# Patient Record
Sex: Female | Born: 1975 | Race: White | Hispanic: No | Smoking: Never smoker
Health system: Southern US, Community
[De-identification: ages and names within clinical notes are randomized; demographics above are authoritative.]

## PROBLEM LIST (undated history)

## (undated) DIAGNOSIS — N3010 Interstitial cystitis (chronic) without hematuria: Secondary | ICD-10-CM

---

## 1999-03-24 ENCOUNTER — Other Ambulatory Visit: Admission: RE | Admit: 1999-03-24 | Discharge: 1999-03-24 | Payer: Self-pay | Admitting: *Deleted

## 2000-04-27 ENCOUNTER — Other Ambulatory Visit: Admission: RE | Admit: 2000-04-27 | Discharge: 2000-04-27 | Payer: Self-pay | Admitting: *Deleted

## 2000-10-16 ENCOUNTER — Ambulatory Visit (HOSPITAL_COMMUNITY): Admission: RE | Admit: 2000-10-16 | Discharge: 2000-10-16 | Payer: Self-pay | Admitting: Urology

## 2001-04-29 ENCOUNTER — Other Ambulatory Visit: Admission: RE | Admit: 2001-04-29 | Discharge: 2001-04-29 | Payer: Self-pay | Admitting: *Deleted

## 2002-03-20 ENCOUNTER — Other Ambulatory Visit: Admission: RE | Admit: 2002-03-20 | Discharge: 2002-03-20 | Payer: Self-pay | Admitting: *Deleted

## 2003-04-21 ENCOUNTER — Other Ambulatory Visit: Admission: RE | Admit: 2003-04-21 | Discharge: 2003-04-21 | Payer: Self-pay | Admitting: *Deleted

## 2004-12-14 ENCOUNTER — Ambulatory Visit (HOSPITAL_COMMUNITY): Admission: RE | Admit: 2004-12-14 | Discharge: 2004-12-14 | Payer: Self-pay | Admitting: *Deleted

## 2017-11-30 DIAGNOSIS — Z86711 Personal history of pulmonary embolism: Secondary | ICD-10-CM | POA: Diagnosis not present

## 2017-11-30 DIAGNOSIS — I2699 Other pulmonary embolism without acute cor pulmonale: Secondary | ICD-10-CM | POA: Diagnosis not present

## 2017-11-30 DIAGNOSIS — I82611 Acute embolism and thrombosis of superficial veins of right upper extremity: Secondary | ICD-10-CM | POA: Diagnosis not present

## 2017-11-30 DIAGNOSIS — Z7901 Long term (current) use of anticoagulants: Secondary | ICD-10-CM | POA: Diagnosis not present

## 2017-12-13 DIAGNOSIS — I2699 Other pulmonary embolism without acute cor pulmonale: Secondary | ICD-10-CM | POA: Diagnosis not present

## 2017-12-13 DIAGNOSIS — R760 Raised antibody titer: Secondary | ICD-10-CM | POA: Diagnosis not present

## 2017-12-13 DIAGNOSIS — Z7901 Long term (current) use of anticoagulants: Secondary | ICD-10-CM | POA: Diagnosis not present

## 2017-12-13 DIAGNOSIS — I82611 Acute embolism and thrombosis of superficial veins of right upper extremity: Secondary | ICD-10-CM | POA: Diagnosis not present

## 2018-03-15 DIAGNOSIS — Z86711 Personal history of pulmonary embolism: Secondary | ICD-10-CM

## 2018-03-15 DIAGNOSIS — Z7901 Long term (current) use of anticoagulants: Secondary | ICD-10-CM

## 2018-03-15 DIAGNOSIS — Z86718 Personal history of other venous thrombosis and embolism: Secondary | ICD-10-CM

## 2021-10-24 ENCOUNTER — Other Ambulatory Visit: Payer: Self-pay

## 2021-10-24 ENCOUNTER — Ambulatory Visit (INDEPENDENT_AMBULATORY_CARE_PROVIDER_SITE_OTHER): Payer: BC Managed Care – PPO | Admitting: Psychiatry

## 2021-10-24 DIAGNOSIS — F331 Major depressive disorder, recurrent, moderate: Secondary | ICD-10-CM
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
## 2021-11-09 ENCOUNTER — Ambulatory Visit (INDEPENDENT_AMBULATORY_CARE_PROVIDER_SITE_OTHER): Payer: BC Managed Care – PPO | Admitting: Psychiatry

## 2021-11-09 DIAGNOSIS — F331 Major depressive disorder, recurrent, moderate: Secondary | ICD-10-CM

## 2021-11-09 NOTE — Progress Notes (Signed)
?    [facility] Counselor/Therapist Progress Note ? ?Patient ID: Jessica Gray, MRN: [id number],   ? ?Date: 11/09/2021 ? ?Time Spent: 58 minutes  ? ?Treatment Type: Individual Therapy ? ?Reported Symptoms: anxiety, restlessness, some tearfulness (some depression and some hormonal), depression(stronger symptom), worrying , "I have control issues too" ? ?Mental Status Exam: ? ?Appearance:   Casual     ?Behavior:  Appropriate, Sharing, and Motivated  ?Motor:  Normal  ?Speech/Language:   Clear and Coherent  ?Affect:  Depressed  ?Mood:  depressed  ?Thought process:  goal directed  ?Thought content:    overthinking  ?Sensory/Perceptual disturbances:    WNL  ?Orientation:  oriented to person, place, time/date, situation, day of week, month of year, year, and stated date of November 09, 2021  ?Attention:  Good  ?Concentration:  Good  ?Memory:  WNL  ?Fund of knowledge:   Good  ?Insight:    Good  ?Judgment:   Good  ?Impulse Control:  Good  ? ?Risk Assessment: ?Danger to Self:  No ?Self-injurious Behavior: No ?Danger to Others: No ?Duty to Warn:no ?Physical Aggression / Violence:No  ?Access to Firearms a concern: No  ?Gang Involvement:No  ? ?Subjective: Patient today reporting depression, anxiety, worrying , over-analyzing. Still pretty restless and lack of feeling purposeful. 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 Struggling feeling like so much of the world is based on marriage and kids "and I don't  fit into either of those, and wonder will I ever be married again and fit in"?  I have friends but even most of them are married and have families, and church is the same. Multiple losses have occurred recently. Focused more on her worrying today, "my mom was a worrier too", I worry excessively. Does daily devotional Verlon Au going to be ok"). Hard to just go "with the flow".  Focusing on letting go more of her doubts about her future. Really needing to work on being more positive  with herself vs negatives and "what's wrong with me".  ? ?Interventions: Solution-Oriented/Positive Psychology, Ego-Supportive, and Insight-Oriented ? ?Treatment goals: ?Treatment goals remain on treatment plan as patient works with strategies to achieve her goals.  Progress is assessed each session and documented in the "subject" and/or "plan" section of treatment note. ?Long-term goal: ?Elevate mood and show evidence of usual energy, activities, and socialization level. ?Short-term goal: ?Verbalize an understanding of the relationship between repressed anger and depressed mood. ?Strategies: ?Identify cognitive self-talk that is engaged in and supports depression and work to change the self-talk to be more positive and proactive.  ? ?Diagnosis: ?  ICD-10-CM   ?1. 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  ? ?Plan: Patient today showing good motivation and active participation in session, focusing on her depression, anxiety, and "not feeling like I'm fitting in at places due to not being married with family and kids." Losses that have occurred due to changes on her job. Occupational concerns  addressed and remain a significant stressor. Control issues are important to patient. To get new dog around May 1st and "that will be good company for me."  Worked on having a more positive focus and outlook versus a more narrow judgmental outlook for herself.  Is genuinely motivated and wanting to "have more self love for who I am, increase my self-confidence, do not be worrying so much about the future, have a more positive attitude, appreciate and living today, and to find  joy in everyday, with less self judgment and less negative assumptions. ? ?Goal review and progress/challenges noted with patient. ? ?Next appointment within 2 to 3 weeks. ? ?This record has been created using [employer].  Chart creation errors have been sought, but may not always have been located and corrected.  Such creation errors do  not reflect on the standard of medical care provided. ? ? ?Mathis Fare, LCSW ? ? ? ? ? ? ? ? ? ? ? ? ? ? ? ? ? ? ?

## 2021-11-28 ENCOUNTER — Ambulatory Visit (INDEPENDENT_AMBULATORY_CARE_PROVIDER_SITE_OTHER): Payer: BC Managed Care – PPO | Admitting: Psychiatry

## 2021-11-28 DIAGNOSIS — F331 Major depressive disorder, recurrent, moderate: Secondary | ICD-10-CM

## 2021-11-28 NOTE — Progress Notes (Signed)
?    [facility] Counselor/Therapist Progress Note ? ?Patient ID: Jessica Gray, MRN: [id number],   ? ?Date: 11/28/2021 ? ?Time Spent: 52 minutes  ? ?Treatment Type: Individual Therapy ? ?Reported Symptoms: depression, worries "a lot", some tearfulness and "may be hormonal", anxiety ? ?Mental Status Exam: ? ?Appearance:   Casual and Neat     ?Behavior:  Appropriate, Sharing, and Motivated  ?Motor:  Normal  ?Speech/Language:   Clear and Coherent  ?Affect:  Depressed and anxiety  ?Mood:  anxious and depressed  ?Thought process:  goal directed  ?Thought content:    Some rumination and overthinking  ?Sensory/Perceptual disturbances:    WNL  ?Orientation:  oriented to person, place, time/date, situation, day of week, month of year, year, and stated date of Nov 28, 2021  ?Attention:  Good  ?Concentration:  Good  ?Memory:  WNL  ?Fund of knowledge:   Good  ?Insight:    Good  ?Judgment:   Good  ?Impulse Control:  Good  ? ?Risk Assessment: ?Danger to Self:  No ?Self-injurious Behavior: No ?Danger to Others: No ?Duty to Warn:no ?Physical Aggression / Violence:No  ?Access to Firearms a concern: No  ?Gang Involvement:No  ? ?Subjective:  Patient in today reporting depression, worrying, anxiety, and overthinking/over-analyzing. Is getting a new dog later this week and having some anxiety about that but also looking forward to it. Decided to stay with her current job for now. "I don't know why I just can't be settled with life with where it is for me."  I want to be content and live a day at a time; I've always tended to look into the future versus present. Wanting to apprecitate the present moment and not focus on the negatives. Worked on this in session today as it relates to her depression and anxiety. Tends to stay in the past of jump ahead, so missing out a lot on the present. Self-critical. I feel like I have a dependence on others. Worries about her mom dying. Ok with dad but not as close. Tearfully processed her fears of  losing mother and with the fear of "dementia running in mom's family". Processed a lot of feelings that she admits has contributed to hold her back from moving forward. Wanting to have more hope and feeling more like that is actually possible for her as we worked on her practicing more self acceptance, pacing herself, keeping her expectations realistic, not being overly self-critical, and practice staying in the present versus the past not too far off into the future.  Less helplessness today.  Her faith is important to her and includes daily devotionals and praying for herself and others which she sees as being helpful to her on her therapy goals as well.  Working to let go of self doubt and trying to believe in herself more, recognizing her positives more than her negatives. ? ?Interventions: Solution-Oriented/Positive Psychology, Ego-Supportive, and Insight-Oriented ? ?Treatment goals: ?Treatment goals remain on treatment plan as patient works with strategies to achieve her goals.  Progress is assessed each session and documented in the "subject" and/or "plan" section of treatment note. ?Long-term goal: ?Elevate mood and show evidence of usual energy, activities, and socialization level. ?Short-term goal: ?Verbalize an understanding of the relationship between repressed anger and depressed mood. ?Strategies: ?Identify cognitive self-talk that is engaged in and supports depression and work to change the self-talk to be more positive and proactive.  ? ?Diagnosis: ?  ICD-10-CM   ?1. 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  ? ?Plan:  Patient today showing active participation and good motivation in session as she focused further on her worrying, depression, anxiety, overthinking/over analyzing, and self criticalness.  Anxiety is somewhat heightened by getting a new dog later this week but she is also looking forward to having a pet again and feeling that it would be a good addition for her.  Decided to  stay in current job for now.  Working hard today on her excessively high expectations of herself and some of the traits that she would like to achieve including being more self accepting and less self-critical, looking more for what might go right versus wrong, staying in the present instead of the past or jumping ahead.  Patient worked well on this in session today admitting where she sees "I sometimes get in my own way" and how she can help relieve this as well and treat herself in healthier and more self-loathing ways.  Worked continuously in session today on staying in the present, stop "beating herself up", talking to herself like she would a good friend rather than harshly, and keeping her expectations realistic as she works on all of the points noted above.  Showed improved motivation today and and seems to really want to create some changes for herself in a positive way.  Also needing to work on "control" issues which will be part of the process going forward.  Wants to not be so narrower in her judgment especially of herself, and have a more positive outlook.  Working to not worry so much about the future as she is discovering how the worry tends to rob her of having peace each day.  Encouraged patient in her practice of more positive behaviors including: Staying in the present and focusing on what she can control or change, believing in herself that she can make significant changes, staying in touch with people who are supportive, looking for more positives versus negatives each day, healthy nutrition and exercise, look more for the positives versus negatives, use encouraging positive self talk, reduce overthinking, saying no when she needs to say no, letting go of things in the past that can hold her back now, and recognize the strength she shows as she works with goal-directed behaviors to move in a direction that supports her improved emotional health ? ?Goal review and progress/challenges noted with  patient. ? ?Next appointment within 2 to 3 weeks. ? ?This record has been created using [employer].  Chart creation errors have been sought, but may not always have been located and corrected.  Such creation errors do not reflect on the standard of medical care provided. ? ? ?Mathis Fare, LCSW ? ? ? ? ? ? ? ? ? ? ? ? ? ? ? ? ? ? ?

## 2021-12-14 ENCOUNTER — Ambulatory Visit (INDEPENDENT_AMBULATORY_CARE_PROVIDER_SITE_OTHER): Payer: BC Managed Care – PPO | Admitting: Psychiatry

## 2021-12-14 DIAGNOSIS — F331 Major depressive disorder, recurrent, moderate: Secondary | ICD-10-CM

## 2021-12-14 NOTE — Progress Notes (Signed)
?    [facility] Counselor/Therapist Progress Note ? ?Patient ID: Jessica Gray, MRN: [id number],   ? ?Date: 12/14/2021 ? ?Time Spent: 52 minutes  ? ?Treatment Type: Individual Therapy ? ?Reported Symptoms: anxiety, some tearfulness usually related to family and "discontent" ? ?Mental Status Exam: ? ?Appearance:   Neat     ?Behavior:  Appropriate, Sharing, and Motivated  ?Motor:  Normal  ?Speech/Language:   Clear and Coherent  ?Affect:  Depressed and anxious  ?Mood:  anxious and depressed  ?Thought process:  goal directed  ?Thought content:    overthinking  ?Sensory/Perceptual disturbances:    WNL  ?Orientation:  oriented to person, place, time/date, situation, day of week, month of year, year, and stated date of Dec 14, 2021  ?Attention:  Good  ?Concentration:  Good and Fair  ?Memory:  WNL  ?Fund of knowledge:   Good  ?Insight:    Good and Fair  ?Judgment:   Good  ?Impulse Control:  Good  ? ?Risk Assessment: ?Danger to Self:  No ?Self-injurious Behavior: No ?Danger to Others: No ?Duty to Warn:no ?Physical Aggression / Violence:No  ?Access to Firearms a concern: No  ?Gang Involvement:No  ? ?Subjective:   Patient today reporting anxiety, overthinking/over analyzing, depression, worrying, and self criticalness. Adjusting to a new puppy she got a week ago. Over-stressing, over-worrying and always looking for what may go wrong versus right. Worked on this in session today with specific examples that she is experiencing in trying to be more positive over all. Processed several of her frequent anxious/fearful/depressive/worrisome thoughts/feelings and able to use those examples and work with in session, transitioning to less anxiety/less fear/less worry and patient seemed to feel a little more empowered at seeing how she can intercept the unproductive thoughts and replace them with more productive/positive/realistic thoughts that do not create so much anxiety, fear, depression, and worry for her on a daily basis.   Definitely more motivated today and seemed to really appreciate the specific examples we worked on in session.  She is to continue practicing this between sessions.  To work on staying in the present and not the past nor too far into the future.  Continue using her faith as a support, and working to let go of self doubt and believe more in herself and what she can accomplish. ? ?Interventions: Ego-Supportive and Insight-Oriented ?  ?Treatment goals: ?Treatment goals remain on treatment plan as patient works with strategies to achieve her goals.  Progress is assessed each session and documented in the "subject" and/or "plan" section of treatment note. ?Long-term goal: ?Elevate mood and show evidence of usual energy, activities, and socialization level. ?Short-term goal: ?Verbalize an understanding of the relationship between repressed anger and depressed mood. ?Strategies: ?Identify cognitive self-talk that is engaged in and supports depression and work to change the self-talk to be more positive and proactive.  ? ?Diagnosis: ?  ICD-10-CM   ?1. 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  ? ?Plan: Today showing good motivation and actively participated in session as she worked further on her anxiety, overthinking/over analyzing, some depression, worrying, and being self-critical (some improvement in how she treats herself).  As noted above she did very well and directly confronting some of her anxieties and fears and more importantly her overall habit of looking for the negatives or what might go wrong versus right.  Very sincere in her desire to change this and already showing noticeable improvement in her insight.  To follow up on  her homework, post reminders at her home, and to use journaling as a tool also.  Seems to be lightening up some on the "need to control".  Motivation improving.  Trying to believe more in herself and overall worry less. Encouraged patient to be practicing more of the positive  behaviors discussed in session including: Staying in the present and focusing on what she can change, believe in herself that she can make significant changes, stay in touch with people who are supportive, look more for the positives versus negatives, healthy nutrition and exercise, use encouraging positive self talk and stop the self negating, saying no when she needs to say no without guilt, reduce overthinking and over analyzing, letting go of things in the past that hold her back now, and realize the strength she shows when working with goal directed behaviors to move in a direction that supports her improved emotional health and overall outlook. ? ?Goal review and progress/challenges noted with patient. ? ?Next appointment within 3 weeks. ? ?This record has been created using [employer].  Chart creation errors have been sought, but may not always have been located and corrected.  Such creation errors do not reflect on the standard of medical care provided. ? ? ?Mathis Fare, LCSW ? ? ? ? ? ? ? ? ? ? ? ? ? ? ? ? ? ? ?

## 2021-12-22 ENCOUNTER — Encounter (HOSPITAL_BASED_OUTPATIENT_CLINIC_OR_DEPARTMENT_OTHER): Payer: Self-pay | Admitting: Emergency Medicine

## 2021-12-22 ENCOUNTER — Emergency Department (HOSPITAL_BASED_OUTPATIENT_CLINIC_OR_DEPARTMENT_OTHER)
Admission: EM | Admit: 2021-12-22 | Discharge: 2021-12-22 | Disposition: A | Discharge status: Home / Self Care | Payer: BC Managed Care – PPO | Attending: Emergency Medicine | Admitting: Emergency Medicine

## 2021-12-22 ENCOUNTER — Other Ambulatory Visit: Payer: Self-pay

## 2021-12-22 ENCOUNTER — Emergency Department (HOSPITAL_BASED_OUTPATIENT_CLINIC_OR_DEPARTMENT_OTHER): Payer: BC Managed Care – PPO

## 2021-12-22 DIAGNOSIS — W19XXXA Unspecified fall, initial encounter: Secondary | ICD-10-CM | POA: Diagnosis not present

## 2021-12-22 DIAGNOSIS — S0990XA Unspecified injury of head, initial encounter: Secondary | ICD-10-CM | POA: Diagnosis present

## 2021-12-22 DIAGNOSIS — Y92009 Unspecified place in unspecified non-institutional (private) residence as the place of occurrence of the external cause: Secondary | ICD-10-CM | POA: Diagnosis not present

## 2021-12-22 HISTORY — DX: Interstitial cystitis (chronic) without hematuria: N30.10

## 2021-12-22 NOTE — ED Notes (Signed)
Pt A&OX4 ambulatory at d/c with independent steady gait. Pt verbalized understanding of d/c instructions and follow up care. 

## 2021-12-22 NOTE — ED Notes (Signed)
Patient transported to CT 

## 2021-12-22 NOTE — ED Notes (Signed)
Ambulated to the room from triage, gait steady.

## 2021-12-22 NOTE — ED Triage Notes (Signed)
Pt had mechanical fall down ~ 4 stairs stairs and hit left posterior side of head on rock.  C/o headache and soreness in area of trama  + blood thinners (Eliquis) Denies LOC, vision changes, n/v, dizziness, fatigue.

## 2021-12-22 NOTE — Discharge Instructions (Signed)
Your history, exam, work-up today did not show evidence of significant intracranial injury.  As you are on blood thinners, fell and hit her head, we did get the CT imaging that did not show evidence of acute bleeding.  You proved stability now for nearly 2 hours without significant symptoms.  We feel you are safe for discharge home.  Please rest and stay hydrated and follow-up with your primary doctor.  If any symptoms change or worsen acutely, please return to the nearest emergency department.

## 2021-12-22 NOTE — ED Provider Notes (Signed)
[facility] EMERGENCY DEPARTMENT Provider Note   CSN: [id number] Arrival date & time: 12/22/21  1911     History  Chief Complaint  Patient presents with   Fall   Head Injury    Jessica Gray is a 46 y.o. female.  The history is provided by the patient and medical records. No language interpreter was used.  Fall This is a new problem. The current episode started 1 to 2 hours ago. The problem occurs rarely. The problem has not changed since onset.Associated symptoms include headaches. Pertinent negatives include no chest pain, no abdominal pain and no shortness of breath. Nothing aggravates the symptoms. Nothing relieves the symptoms. She has tried nothing for the symptoms.  Head Injury Associated symptoms: headache   Associated symptoms: no nausea, no neck pain and no vomiting       Home Medications Prior to Admission medications   Not on File      Allergies    Patient has no known allergies.    Review of Systems   Review of Systems  Constitutional:  Negative for chills, fatigue and fever.  HENT:  Negative for congestion.   Eyes:  Negative for visual disturbance.  Respiratory:  Negative for cough, chest tightness and shortness of breath.   Cardiovascular:  Negative for chest pain.  Gastrointestinal:  Negative for abdominal pain, constipation, diarrhea, nausea and vomiting.  Genitourinary:  Negative for flank pain.  Musculoskeletal:  Negative for back pain, neck pain and neck stiffness.  Skin:  Negative for rash and wound.  Neurological:  Positive for headaches. Negative for dizziness, facial asymmetry, weakness and light-headedness.  Psychiatric/Behavioral:  Negative for agitation and confusion.   All other systems reviewed and are negative.  Physical Exam Updated Vital Signs BP 131/78 (BP Location: Right Arm)   Pulse 81   Temp 98.4 F (36.9 C) (Oral)   Resp 18   Ht 5\' 1"  (1.549 m)   Wt 76.7 kg   LMP 11/28/2021 (Approximate)   SpO2 99%   BMI  31.93 kg/m  Physical Exam Vitals and nursing note reviewed.  Constitutional:      General: She is not in acute distress.    Appearance: She is well-developed. She is not ill-appearing, toxic-appearing or diaphoretic.  HENT:     Head:      Comments: Old left parietal tenderness and possible small hematoma.  No laceration.  No crepitance.  No focal neurologic deficits    Nose: Nose normal.     Mouth/Throat:     Mouth: Mucous membranes are moist.  Eyes:     Extraocular Movements: Extraocular movements intact.     Conjunctiva/sclera: Conjunctivae normal.     Pupils: Pupils are equal, round, and reactive to light.  Cardiovascular:     Rate and Rhythm: Normal rate and regular rhythm.     Heart sounds: No murmur heard. Pulmonary:     Effort: Pulmonary effort is normal. No respiratory distress.     Breath sounds: Normal breath sounds. No wheezing, rhonchi or rales.  Chest:     Chest wall: No tenderness.  Abdominal:     General: Abdomen is flat.     Palpations: Abdomen is soft.     Tenderness: There is no abdominal tenderness.  Musculoskeletal:        General: Tenderness present. No swelling.     Cervical back: Neck supple. No tenderness.  Skin:    General: Skin is warm and dry.     Capillary Refill: Capillary  refill takes less than 2 seconds.     Findings: No erythema.  Neurological:     General: No focal deficit present.     Mental Status: She is alert.     Sensory: No sensory deficit.     Motor: No weakness.     Coordination: Coordination normal.  Psychiatric:        Mood and Affect: Mood normal.    ED Results / Procedures / Treatments   Labs (all labs ordered are listed, but only abnormal results are displayed) Labs Reviewed - No data to display  EKG None  Radiology No results found.  Procedures Procedures    Medications Ordered in ED Medications - No data to display  ED Course/ Medical Decision Making/ A&P                           Medical Decision  Making Amount and/or Complexity of Data Reviewed Radiology: ordered.    VADA SWIFT is a 46 y.o. 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  According to patient, she was feeling at her baseline and normal today when she was sweeping her porch when she tripped and fell down 4 stairs hitting some rocks with her head.  She reports hitting her left parietal/occipital area but did not lose consciousness.  She denies any other injuries or complaints.  Denies any neck pain, chest pain, abdominal pain, or extremity pains.  Denies any vision changes or nausea or vomiting.  Denies any other complaints.  Headache is mild at this time and she denies any other problems.  Given her Eliquis use, she presents for evaluation for head injury.  Patient will be worked up to rule out potentially life-threatening injury with head injury on blood thinners.   On exam, head was slightly tender at the area of injury however no laceration or large hematoma seen.  No crepitance.  No focal neurologic deficits.  Neck nontender.  Chest nontender.  Abdomen nontender.  Lungs clear.  Patient otherwise well-appearing.  Had a shared decision-making conversation with patient we agreed to get a head CT given the Eliquis use and falling hitting her head.  If CT is reassuring, anticipate discharge home.  CT head showed no acute traumatic injury.  Patient informed of findings and we feel she is safe for discharge home.  She denies any significant headache at this time.  Patient will follow-up with PCP and understood return precautions for delayed bleed.  She had no other questions or concerns and was discharged in good condition.         Final Clinical Impression(s) / ED Diagnoses Final diagnoses:  Fall in home, initial encounter  Injury of head, initial encounter    Clinical  Impression: 1. Fall in home, initial encounter   2. Injury of head, initial encounter     Disposition: Discharge  Condition: Good  I have discussed the results, Dx and Tx plan with the pt(& family if present). He/she/they expressed understanding and agree(s) with the plan. Discharge instructions discussed at great length. Strict return precautions discussed and pt &/or family have verbalized understanding of the instructions. No further questions at time of discharge.    There are no discharge medications for this patient.   Follow Up: Gordan Payment., MD [address] [place] [address] [phone]  [facility] EM[facility]ENC[facility] DEPART[facility] [address] [id number] mc [address] [id number] [phone]        Kimon Loewen, Canary Brimhristopher J, MD 12/22/21 2126

## 2021-12-28 ENCOUNTER — Ambulatory Visit (INDEPENDENT_AMBULATORY_CARE_PROVIDER_SITE_OTHER): Payer: BC Managed Care – PPO | Admitting: Psychiatry

## 2021-12-28 DIAGNOSIS — F331 Major depressive disorder, recurrent, moderate: Secondary | ICD-10-CM

## 2021-12-28 NOTE — Progress Notes (Signed)
[facility] Counselor/Therapist Progress Note  Patient ID: Jessica Gray, MRN: [id number],    Date: 12/28/2021  Time Spent: 55 minutes   Treatment Type: Individual Therapy  Reported Symptoms:  depressed (some hopelessness "but not thinking of harming myself"), "also wonder if my hormones are off", mood fluctuates, some tearfulness, stressed, overthinking  Mental Status Exam:  Appearance:   Neat     Behavior:  Appropriate, Sharing, and Motivated  Motor:  Normal  Speech/Language:   Clear and Coherent  Affect:  Anxious, depressed  Mood:  anxious, depressed, and sad  Thought process:  goal directed  Thought content:    overthinking  Sensory/Perceptual disturbances:    WNL  Orientation:  oriented to person, place, time/date, situation, day of week, month of year, year, and stated date of Dec 28, 2021  Attention:  Good  Concentration:  Good  Memory:  WNL  Fund of knowledge:   Good  Insight:    Good  Judgment:   Good  Impulse Control:  Good   Risk Assessment: Danger to Self:  No Self-injurious Behavior: No Danger to Others: No Duty to Warn:no Physical Aggression / Violence:No  Access to Firearms a concern: No  Gang Involvement:No   Subjective:  Patient in today reporting anxiety, worrying, over analyzing and over thinking, some depression,but her self-criticalness "has gotten better". Has "been on Zoloft through PCP for over 6 months but it really hasn't helped, nor did Buspar". Has recently had more tearfulness, feeling alone, and what is my purpose. Denies any SI. Moodiness and some days are worse than others.  Has used Xanax as needed per PCP but not recently. Has used Ativan .5mg  some recently which does help a little bit. Adjusting to a new puppy she has had a few weeks.  Self-criticalness not quite as bad "since we spoke about it last session." Some over-worrying and stressing as her tendency is to look more for what might go wrong versus right." Some good days but  hard to stay in positive mindset. Is in contact with friends, including one that knows patient is struggling emotionally right now. Has been 20 yrs since she was married and has not married again. Hoping to meet more people through "life-groups" at church.  Is doing some better at "staying in today and not jumping too far into the future."  Interventions: Solution-Oriented/Positive Psychology, Ego-Supportive, and Insight-Oriented  Treatment goals: Treatment goals remain on treatment plan as patient works with strategies to achieve her goals.  Progress is assessed each session and documented in the "subject" and/or "plan" section of treatment note. Long-term goal: Elevate mood and show evidence of usual energy, activities, and socialization level. Short-term goal: Verbalize an understanding of the relationship between repressed anger and depressed mood. Strategies: Identify cognitive self-talk that is engaged in and supports depression and work to change the self-talk to be more positive and proactive.   Diagnosis:   ICD-10-CM   1. 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  Got her scheduled to see , DNP  for medication evaluation within a month.  Her self criticalness has decreased.  Some difficulty when she does not feel and "control of things".  Does have some better moments but is also having significant "  downness, some irritability, overthinking, anxiety, worrying and assuming the worst, and difficulty hanging onto more positive feelings."  Denies any SI.  Has good close family relationships and some friends.  Involved at her church.  Is enjoying her puppy that she has had a few weeks.  To continue involvement with  family and friends, work on not being so judgmental of herself especially at work, reaching out to others and getting involved in the group she spoke about today at her church, interrupting negative thinking and replacing it with more accurate and typically positive thinking, emphasize her strengths, enjoying getting out with her dog, find more activities that she enjoys, and work on letting go of some things in the past that contribute to holding her back in the present from moving forward. Encouraged patient in her practice of more positive behaviors discussed in session including: Believing in herself that she can make significant changes, staying in the present and focusing on what she can change versus not change, remaining in touch with people who are supportive, looking more for the positives versus negatives daily, healthy nutrition and exercise, use encouraging positive self talk and stop the self negating, saying no when she needs to say no without guilt, reduce overthinking and over analyzing, letting go of things in the past that hold her back now, and recognize the strength she shows when she works with goal-directed behaviors to move in a direction that supports her improved emotional health, self-confidence, and decision making.  Review and progress/challenges noted with patient.  Next appointment within 2 to 3 weeks.  This record has been created using [employer].  Chart creation errors have been sought, but may not always have been located and corrected.  Such creation errors do not reflect on the standard of medical care provided.   Mathis Fare, LCSW

## 2022-01-09 ENCOUNTER — Ambulatory Visit: Payer: BC Managed Care – PPO | Admitting: Psychiatry

## 2022-01-19 ENCOUNTER — Encounter: Payer: Self-pay | Admitting: Adult Health

## 2022-01-19 ENCOUNTER — Ambulatory Visit: Payer: BC Managed Care – PPO | Admitting: Adult Health

## 2022-01-19 VITALS — BP 124/75 | Ht 61.0 in | Wt 171.0 lb

## 2022-01-19 DIAGNOSIS — F411 Generalized anxiety disorder: Secondary | ICD-10-CM | POA: Diagnosis not present

## 2022-01-19 DIAGNOSIS — F331 Major depressive disorder, recurrent, moderate: Secondary | ICD-10-CM | POA: Diagnosis not present

## 2022-01-19 MED ORDER — SERTRALINE HCL 100 MG PO TABS: 100.0000 mg | ORAL_TABLET | Freq: Every day | ORAL | 2 refills | Status: DC

## 2022-01-19 MED ORDER — BUPROPION HCL ER (XL) 150 MG PO TB24: ORAL_TABLET | ORAL | 2 refills | Status: DC

## 2022-01-19 NOTE — Progress Notes (Signed)
[facility] MD/PA/NP Initial Note  01/19/2022 4:23 PM Jessica Gray  MRN:  [id number]  Chief Complaint:   HPI:   Patient seen in the clinic today for initial psychiatric evaluation.  Previously seen by Dr. Donell Beers.  Describes mood today as "not so good". Pleasant. Tearful at times. Mood symptoms - reports depression, anxiety and irritability. Reports worry and rumination. Reports over thinking - going to the worst possible conclusion - blowing things up bigger than they are. Has a hard time letting go of things. 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 Hard for me to just relax and let go. Not coping well. Stating "life feels overwhelming - not in a place I thought I would be in". Having a difficult time finding joy in things. Decreased interest and motivation. Taking medications as prescribed.  Energy levels lower. Active, does not have a regular exercise routine - hip pain. Enjoys some usual interests and activities. Divorced. No children. Relationship of 10 years ended. Lives alone with dog - 4 months old. Spending time with family and friends. Talking to mother several times a day - "we are really close. Appetite adequate. Weight stable. Sleeps well most nights. Averages 6 to 7 hours. Focus and concentration stable. Completing tasks. Managing aspects of household. Works as a Physicist, medical Denies SI or HI.  Denies AH or VH.  Previous medication trials:  multiple trials  Visit Diagnosis:    ICD-10-CM   1. Major depressive disorder, recurrent episode, moderate (HCC)  F33.1 buPROPion (WELLBUTRIN XL) 150 MG 24 hr tablet    sertraline (ZOLOFT) 100 MG tablet    2. Generalized anxiety disorder  F41.1 buPROPion (WELLBUTRIN XL) 150 MG 24 hr tablet    sertraline (ZOLOFT) 100 MG tablet      Past Psychiatric History: Denies psychiatric hospitalization.   Past Medical History:  Past Medical History:  Diagnosis Date   Interstitial cystitis    No past  surgical history on file.  Family Psychiatric History: Denies any family history of mental illness.   Family History: No family history on file.  Social History:  Social History   Socioeconomic History   Marital status: Divorced    Spouse name: Not on file   Number of children: Not on file   Years of education: Not on file   Highest education level: Not on file  Occupational History   Not on file  Tobacco Use   Smoking status: Never   Smokeless tobacco: Never  Substance and Sexual Activity   Alcohol use: Not Currently   Drug use: Never   Sexual activity: Yes  Other Topics Concern   Not on file  Social History Narrative   Not on file   Social Determinants of Health   Financial Resource Strain: Not on file  Food Insecurity: Not on file  Transportation Needs: Not on file  Physical Activity: Not on file  Stress: Not on file  Social Connections: Not on file    Allergies: No Known Allergies  Metabolic Disorder Labs: No results found for: "HGBA1C", "MPG" No results found for: "PROLACTIN" No results found for: "CHOL", "TRIG", "HDL", "CHOLHDL", "VLDL", "LDLCALC" No results found for: "TSH"  Therapeutic Level Labs: No results found for: "LITHIUM" No results found for: "VALPROATE" No results found for: "CBMZ"  Current Medications: Current Outpatient Medications  Medication Sig Dispense Refill   buPROPion (WELLBUTRIN XL) 150 MG 24 hr tablet Take one tablet every morning for 7 days, then increase to two  tablets every morning. 60 tablet 2   sertraline (ZOLOFT) 100 MG tablet Take 1 tablet (100 mg total) by mouth daily. 30 tablet 2   sertraline (ZOLOFT) 100 MG tablet Take 100 mg by mouth daily.     No current facility-administered medications for this visit.    Medication Side Effects: none  Orders placed this visit:  No orders of the defined types were placed in this encounter.   Psychiatric Specialty Exam:  Review of Systems  Musculoskeletal:  Negative for gait  problem.  Neurological:  Negative for tremors.  Psychiatric/Behavioral:         Please refer to HPI    Last menstrual period 11/28/2021.There is no height or weight on file to calculate BMI.  General Appearance: Casual and Neat  Eye Contact:  Good  Speech:  Clear and Coherent and Normal Rate  Volume:  Normal  Mood:  Depressed  Affect:  Appropriate and Congruent  Thought Process:  Coherent and Descriptions of Associations: Intact  Orientation:  Full (Time, Place, and Person)  Thought Content: Logical   Suicidal Thoughts:  No  Homicidal Thoughts:  No  Memory:  WNL  Judgement:  Good  Insight:  Good  Psychomotor Activity:  Normal  Concentration:  Concentration: Good  Recall:  Good  Fund of Knowledge: Good  Language: Good  Assets:  Communication Skills Desire for Improvement Financial Resources/Insurance Housing Intimacy Leisure Time Physical Health Resilience Social Support Talents/Skills Transportation Vocational/Educational  ADL's:  Intact  Cognition: WNL  Prognosis:  Good   Screenings:  Flowsheet Row ED from 12/22/2021 in [facility] EM[facility]  C-SSRS RISK CATEGORY No Risk       Receiving Psychotherapy: Yes   Treatment Plan/Recommendations:   Plan:  PDMP reviewed  Continue Zoloft 100mg  daily Add Wellbutrin XL 150mg  every morning x 7, then increase to 300mg  daily - denies seizure history.  Time spent with patient was 60 minutes. Greater than 50% of face to face time with patient was spent on counseling and coordination of care.    RTC 4 weeks  Patient advised to contact office with any questions, adverse effects, or acute worsening in signs and symptoms.     , NP

## 2022-01-23 ENCOUNTER — Other Ambulatory Visit: Payer: Self-pay | Admitting: Adult Health

## 2022-01-23 ENCOUNTER — Ambulatory Visit (INDEPENDENT_AMBULATORY_CARE_PROVIDER_SITE_OTHER): Payer: BC Managed Care – PPO | Admitting: Psychiatry

## 2022-01-23 ENCOUNTER — Telehealth: Payer: Self-pay | Admitting: Adult Health

## 2022-01-23 DIAGNOSIS — F411 Generalized anxiety disorder: Secondary | ICD-10-CM

## 2022-01-23 DIAGNOSIS — F331 Major depressive disorder, recurrent, moderate: Secondary | ICD-10-CM | POA: Diagnosis not present

## 2022-01-23 MED ORDER — LORAZEPAM 0.5 MG PO TABS: 0.5000 mg | ORAL_TABLET | Freq: Two times a day (BID) | ORAL | 0 refills | Status: DC | PRN

## 2022-01-23 NOTE — Telephone Encounter (Signed)
LVM to RC 

## 2022-02-06 ENCOUNTER — Ambulatory Visit (INDEPENDENT_AMBULATORY_CARE_PROVIDER_SITE_OTHER): Payer: BC Managed Care – PPO | Admitting: Psychiatry

## 2022-02-06 DIAGNOSIS — F411 Generalized anxiety disorder: Secondary | ICD-10-CM | POA: Diagnosis not present

## 2022-02-06 NOTE — Progress Notes (Signed)
[facility] Counselor/Therapist Progress Note  Patient ID: Jessica Gray, MRN: [id number],    Date: 02/06/2022  Time Spent: 58 minutes  Treatment Type: Individual Therapy  Reported Symptoms: anxiety, depression  Mental Status Exam:  Appearance:   Casual     Behavior:  Appropriate, Sharing, and Motivated  Motor:  Normal  Speech/Language:   Clear and Coherent  Affect:  anxious  Mood:  anxious  Thought process:  goal directed  Thought content:    Some overthinking  Sensory/Perceptual disturbances:    WNL  Orientation:  oriented to person, place, time/date, situation, day of week, month of year, year, and stated date of February 06, 2022  Attention:  Fair  Concentration:  Good and Fair  Memory:  WNL  Fund of knowledge:   Good  Insight:    Good  Judgment:   Good  Impulse Control:  Fair   Risk Assessment: Danger to Self:  No Self-injurious Behavior: No Danger to Others: No Duty to Warn:no Physical Aggression / Violence:No  Access to Firearms a concern: No  Gang Involvement:No   Subjective:  Patient in today reporting anxiety, depression, irritability. " I think some things are just  situational and maybe hormonal at times. Tying not to buy things I don't really need. Get upset with myself for not making better decisions. Trying to catch up some financially at this point. Feels her mind is "overactive" but "I don't think I'm  as critical toward myself." Coaching herself along to use more good judgment. 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 Trying to make self-talk less critical and more positive. Some progress in trying not to assume worst case scenarios, "but it is a work in progress." New dog is going for training over next couple weeks due to some biting and other mild-moderate aggressive behaviors. Working to decrease her self-criticalness. Open to meeting  new friends and looking for new church in her area. No tearfulness today. Showing a little more energy today in working on goal-directed behavior and noticing some progress.   Interventions: Cognitive Behavioral Therapy and Solution-Oriented/Positive Psychology  Treatment goals: Treatment goals remain on treatment plan as patient works with strategies to achieve her goals.  Progress is assessed each session and documented in the "subject" and/or "plan" section of treatment note. Long-term goal: Elevate mood and show evidence of usual energy, activities, and socialization level. Short-term goal: Verbalize an understanding of the relationship between repressed anger and depressed mood. Strategies: Identify cognitive self-talk that is engaged in and supports depression and work to change the self-talk to be more positive and proactive.    Diagnosis:   ICD-10-CM   1. Generalized anxiety disorder  F41.1      Plan: Patient today showin good participation and motivation as she worked on her anxiety, irritability, and depression, a lot of which had to do with her feelings about herself and decisions she makes.  Was able to process some of these issues regarding herself without being quite as self critical nor defeating.  Still feeling some guilt feelings but is working on better understanding them as discussed in session today and trying to let go more.  Making some progress in her self criticalness as it seems to be occurring less, per her report.  Wanting to improve how she feels about herself and life in general and was more engaging today, showing  more energy.  Feels her medication is helping her more.  Better able to recognize when her negative thoughts or feelings affect her and trying to interrupt them or change them to be more positive and reality based, and this is a work in progress.  She is however having some success at being able to "look at things in different ways".  Continues to deny any  SI.  Working on increasing and strengthening her relationships with others in order to feel more connected.  Does have good relationships within her family.  Spoke about wanting to visit a church or to near her and consider making a change.  Feels that she is becoming less judgmental of herself and this is taking some deliberate effort on her part, and she seems to be understanding the positive effect that can have on how she is feeling.  Not reporting as much over worrying more recently. Encouraged patient in her practice of more positive behaviors including: Staying in the present and focusing on what she can change versus cannot, leaving more in herself that she can make significant changes that would be positive for her going forward, remaining in touch with people who are supportive, looking more for the positives each day versus negatives, healthy nutrition and exercise, positive self talk, reduce overthinking and over analyzing, saying no when she needs to say no without guilt, letting go of things in the past that hold her back now, and recognize the strength she shows working with goal directed behaviors to move in a direction that supports her improved emotional health.  Goal review and progress/challenges noted with patient.  Next appointment within 2 weeks.  This record has been created using [employer].  Chart creation errors have been sought, but may not always have been located and corrected.  Such creation errors do not reflect on the standard of medical care provided.   Mathis Fare, LCSW

## 2022-02-20 ENCOUNTER — Ambulatory Visit: Payer: BC Managed Care – PPO | Admitting: Adult Health

## 2022-02-20 ENCOUNTER — Ambulatory Visit: Payer: BC Managed Care – PPO | Admitting: Psychiatry

## 2022-02-20 ENCOUNTER — Encounter: Payer: Self-pay | Admitting: Adult Health

## 2022-02-20 DIAGNOSIS — F411 Generalized anxiety disorder: Secondary | ICD-10-CM

## 2022-02-20 DIAGNOSIS — F331 Major depressive disorder, recurrent, moderate: Secondary | ICD-10-CM | POA: Diagnosis not present

## 2022-02-20 MED ORDER — SERTRALINE HCL 100 MG PO TABS: 100.0000 mg | ORAL_TABLET | Freq: Every day | ORAL | 1 refills | Status: DC

## 2022-02-20 MED ORDER — BUPROPION HCL ER (XL) 150 MG PO TB24: ORAL_TABLET | ORAL | 1 refills | Status: DC

## 2022-02-20 MED ORDER — LORAZEPAM 0.5 MG PO TABS: 0.5000 mg | ORAL_TABLET | Freq: Two times a day (BID) | ORAL | 2 refills | Status: AC | PRN

## 2022-02-20 NOTE — Progress Notes (Signed)
Jessica Gray [id number] [birth year] 46 y.o.  Subjective:   Patient ID:  Jessica Gray is a 46 y.o. (DOB 06-14-1976) female.  Chief Complaint: No chief complaint on file.   HPI Jessica Gray presents to the office today for follow-up of GAD and MDD.  Previously seen by Dr. Donell Beers.  Describes mood today as "better". Pleasant. Tearful at times. Mood symptoms - reports decreased depression, anxiety and irritability. Reports decreased worry and rumination. Reports over thinking - "better". Not feeling as overwhelmed. Stating "I am doing better". Feels like the addition of Wellbutrin has been helpful. Improved interest and motivation. Taking medications as prescribed.  Energy levels lower. Active, does not have a regular exercise routine - hip pain. Enjoys some usual interests and activities. Divorced. No children. Lives alone with dog - "Sadie" - 5 months old - Labradoodle - currently in a training program. Spending time with family and friends. Talking to mother daily. Appetite adequate. Weight stable. Sleeps well most nights. Averages 6 to 7 hours. Focus and concentration stable. Completing tasks. Managing aspects of household. Works as a Physicist, medical Denies SI or HI.  Denies AH or VH.  Previous medication trials:  multiple trials   Flowsheet Row ED from 12/22/2021 in [facility] [place] [facility]  C-SSRS RISK CATEGORY No Risk        Review of Systems:  Review of Systems  Musculoskeletal:  Negative for gait problem.  Neurological:  Negative for tremors.  Psychiatric/Behavioral:         Please refer to HPI    Medications: I have reviewed the patient's current medications.  Current Outpatient Medications  Medication Sig Dispense Refill   buPROPion (WELLBUTRIN XL) 150 MG 24 hr tablet Take one tablet every morning for 7 days, then increase to two tablets every morning. 90 tablet 1   LORazepam (ATIVAN) 0.5 MG tablet Take 1 tablet (0.5 mg total) by  mouth 2 (two) times daily as needed. 30 tablet 2   sertraline (ZOLOFT) 100 MG tablet Take 1 tablet (100 mg total) by mouth daily. 90 tablet 1   No current facility-administered medications for this visit.    Medication Side Effects: None  Allergies: No Known Allergies  Past Medical History:  Diagnosis Date   Interstitial cystitis     Past Medical History, Surgical history, Social history, and Family history were reviewed and updated as appropriate.   Please see review of systems for further details on the patient's review from today.   Objective:   Physical Exam:  There were no vitals taken for this visit.  Physical Exam Constitutional:      General: She is not in acute distress. Musculoskeletal:        General: No deformity.  Neurological:     Mental Status: She is alert and oriented to person, place, and time.     Coordination: Coordination normal.  Psychiatric:        Attention and Perception: Attention and perception normal. She does not perceive auditory or visual hallucinations.        Mood and Affect: Mood normal. Mood is not anxious or depressed. Affect is not labile, blunt, angry or inappropriate.        Speech: Speech normal.        Behavior: Behavior normal.        Thought Content: Thought content normal. Thought content is not paranoid or delusional. Thought content does not include homicidal or suicidal ideation. Thought content does not include homicidal or  suicidal plan.        Cognition and Memory: Cognition and memory normal.        Judgment: Judgment normal.     Comments: Insight intact     Lab Review:  No results found for: "NA", "K", "CL", "CO2", "GLUCOSE", "BUN", "CREATININE", "CALCIUM", "PROT", "ALBUMIN", "AST", "ALT", "ALKPHOS", "BILITOT", "GFRNONAA", "GFRAA"  No results found for: "WBC", "RBC", "HGB", "HCT", "PLT", "MCV", "MCH", "MCHC", "RDW", "LYMPHSABS", "MONOABS", "EOSABS", "BASOSABS"  No results found for: "POCLITH", "LITHIUM"   No  results found for: "PHENYTOIN", "PHENOBARB", "VALPROATE", "CBMZ"   .res Assessment: Plan:    Plan:  PDMP reviewed  Zoloft 100mg  daily Wellbutrin XL 300mg  daily - denies seizure history.  Time spent with patient was 20 minutes. Greater than 50% of face to face time with patient was spent on counseling and coordination of care.    RTC 8 weeks  Patient advised to contact office with any questions, adverse effects, or acute worsening in signs and symptoms.   Diagnoses and all orders for this visit:  Major depressive disorder, recurrent episode, moderate (HCC) -     buPROPion (WELLBUTRIN XL) 150 MG 24 hr tablet; Take one tablet every morning for 7 days, then increase to two tablets every morning. -     sertraline (ZOLOFT) 100 MG tablet; Take 1 tablet (100 mg total) by mouth daily.  Generalized anxiety disorder -     buPROPion (WELLBUTRIN XL) 150 MG 24 hr tablet; Take one tablet every morning for 7 days, then increase to two tablets every morning. -     LORazepam (ATIVAN) 0.5 MG tablet; Take 1 tablet (0.5 mg total) by mouth 2 (two) times daily as needed. -     sertraline (ZOLOFT) 100 MG tablet; Take 1 tablet (100 mg total) by mouth daily.     Please see After Visit Summary for patient specific instructions.  Future Appointments  Date Time Provider Department Center  03/06/2022 12:00 PM , LCSW [facility] None  03/20/2022  3:00 PM Mathis Fare, LCSW [facility] None  04/04/2022  4:00 PM Mathis Fare, LCSW [facility] None    No orders of the defined types were placed in this encounter.   -------------------------------

## 2022-02-20 NOTE — Progress Notes (Signed)
[facility] Counselor/Therapist Progress Note  Patient ID: Jessica Gray, MRN: [id number],    Date: 02/20/2022  Time Spent: 55 minutes   Treatment Type: Individual Therapy  Reported Symptoms: anxiety, some "obsessive thoughts and overthinking and is improving with this"  Mental Status Exam:  Appearance:   Neat     Behavior:  Appropriate, Sharing, and Motivated  Motor:  Normal  Speech/Language:   Clear and Coherent  Affect:  anxiety  Mood:  anxious  Thought process:  goal directed  Thought content:    Some obsessiveness and overthinking  Sensory/Perceptual disturbances:    WNL  Orientation:  oriented to person, place, time/date, situation, day of week, month of year, year, and stated date of February 20, 2022  Attention:  Good  Concentration:  Good  Memory:  WNL  Fund of knowledge:   Good  Insight:    Good  Judgment:   Good  Impulse Control:  Good   Risk Assessment: Danger to Self:  No Self-injurious Behavior: No Danger to Others: No Duty to Warn:no Physical Aggression / Violence:No  Access to Firearms a concern: No  Gang Involvement:No   Subjective: Patient today reporting anxiety related to personal, family, and financial stressors. Some obsessiveness and overthinking "but this is improving" gradually. Needed to process some of her decision-making today regarding stressors, and hard to let-go of some things and some overthinking. States she continues to watch her finances and make better choices.  Trying not to dwell on negative thoughts. Looking for more positives.Wants to return to church but wanting to try a new church that might feel like a better fit. Working more on not assuming "the worst" and seeing some progress. Not quite as self-critical and this is a work in progress. Decreasing negative self-talk. Pet dog to return Wednesday from dog obedience training. Some decrease in her self-criticalness. No tearfulness. Energy slowly improving.   Interventions:  Cognitive Behavioral Therapy   Treatment goals: Treatment goals remain on treatment plan as patient works with strategies to achieve her goals.  Progress is assessed each session and documented in the "subject" and/or "plan" section of treatment note. Long-term goal: Elevate mood and show evidence of usual energy, activities, and socialization level. Short-term goal: Verbalize an understanding of the relationship between repressed anger and depressed mood. Strategies: Identify cognitive self-talk that is engaged in and supports depression and work to change the self-talk to be more positive and proactive.   Diagnosis:   ICD-10-CM   1. Generalized anxiety disorder  F41.1      Plan: Patient today showing good motivation and participation working further on her anxiety especially related to personal and financial stressors which is often exacerbated by her obsessiveness and overthinking.  She did state today that she feels she is making progress in decreasing both of those behaviors.  Very little irritability.  No depression reported today.  Self criticalness decreased.  Some guilt feelings at times around financial issues but feels that is decreasing as she comes up with a plan for better financial management and paying off some debt.  More engaging today, more smiling, showing more energy.  Does feel her medication is helping.  Picking up more quickly when her negative thoughts emerge in trying to interrupt them in order to think in a more positive direction.  Continues to work on increasing and strengthening her relationships in order to feel more connected with others.  Has good family contacts.  Checking out a different church and plans  to watch a couple of them on line before visiting.  Less self judging.  Continues to work on her excessive worrying and that is showing some decline. Encouraged patient in her practice of positive behaviors including: Staying in the present and focusing on what she  can change, believing more in herself that she can make significant changes that would be positive for her going forward, remaining in touch with people who are supportive, looking more for the positives each day versus negatives, healthy nutrition and exercise, positive self talk, reducing overthinking and over analyzing, saying no when she needs to say no without guilt, letting go of things in the past that hold her back now, and realize the strength she shows working with goal directed behaviors to move in a direction that supports her improved emotional health and overall outlook.  Goal review and progress/challenges noted with patient.  Next appointment within 2 to 3 weeks.  This record has been created using [employer].  Chart creation errors have been sought, but may not always have been located and corrected.  Such creation errors do not reflect on the standard of medical care provided.  Mathis Fare, LCSW

## 2022-03-06 ENCOUNTER — Ambulatory Visit (INDEPENDENT_AMBULATORY_CARE_PROVIDER_SITE_OTHER): Payer: BC Managed Care – PPO | Admitting: Psychiatry

## 2022-03-06 DIAGNOSIS — F411 Generalized anxiety disorder: Secondary | ICD-10-CM | POA: Diagnosis not present

## 2022-03-06 NOTE — Progress Notes (Signed)
[facility] Counselor/Therapist Progress Note  Patient ID: NECOLE MINASSIAN, MRN: [id number],    Date: 03/06/2022  Time Spent: 50 minutes   Treatment Type: Individual Therapy  Reported Symptoms: anxiety, sometimes jumping to conclusions, less tearfulness; states anxiety is her main symptom but progressing some  Mental Status Exam:  Appearance:   Well Groomed     Behavior:  Appropriate, Sharing, and Motivated  Motor:  Normal  Speech/Language:   Clear and Coherent  Affect:  Anxious, some depression  Mood:  anxious and depressed  Thought process:  goal directed  Thought content:    Decreased overthinking and obsessiveness  Sensory/Perceptual disturbances:    WNL  Orientation:  oriented to person, place, time/date, situation, day of week, month of year, year, and stated date of March 06, 2022  Attention:  Good  Concentration:  Good  Memory:  WNL  Fund of knowledge:   Good  Insight:    Good  Judgment:   Good  Impulse Control:  Good   Risk Assessment: Danger to Self:  No Self-injurious Behavior: No Danger to Others: No Duty to Warn:no Physical Aggression / Violence:No  Access to Firearms a concern: No  Gang Involvement:No   Subjective:   Patient in today reporting anxiety, some depression, less tearfulness, and still jumping to conclusions and looking for what might go wrong (but not as often).  Symptoms mostly related to personal, family, financial, and issues with her new pet dog. Problem-solved some anger issues involving a personal situation. Enjoying her new dog and trying to get more training in working with her. Anxiety heightened personally but managing it some better. Frustrated with trainer she used recently to help her dog. Over past week I've noticed handling frustrations better. Making some gains with her anxiety. Working to be more tolerant when things to do not as planned or wanted (especially with some edginess "but my Ativan" helped and been ok since). Some  decrease in obsessiveness, and difficulty letting go of overthinking. Visited another church this past weekend and seemed to like it but wants to "check it out again. Decrease in self-criticalness, more positive self-talk. Energy slightly improved.  Interventions: Cognitive Behavioral Therapy and Ego-Supportive  Treatment goals: Treatment goals remain on treatment plan as patient works with strategies to achieve her goals.  Progress is assessed each session and documented in the "subject" and/or "plan" section of treatment note. Long-term goal: Elevate mood and show evidence of usual energy, activities, and socialization level. Short-term goal: Verbalize an understanding of the relationship between repressed anger and depressed mood. Strategies: Identify cognitive self-talk that is engaged in and supports depression and work to change the self-talk to be more positive and proactive.    Diagnosis:   ICD-10-CM   1. Generalized anxiety disorder  F41.1      Plan: Patient today showing real good participation and motivation as she worked on further resolution of depressive feelings and also her anxiety.  Specific work on not jumping to conclusions so quickly and looking for what might go wrong which she seemed to appreciate and acknowledge that this is an area that she wants to continue improvement.  Feeling some improvement in her mood and following through on goal directed behaviors.  Visited a new church recently that she had been wanting to visit and had a good experience there "so far".  Some some continued frustrations regarding her new dog and training she is to receive.  Less self criticalness.  "Definitely less depression".  Feels that her energy has improved some as noted by her persistence in getting things done at work and home.  Encouraged to continue focus on more positive self talk and self-care.  Reports Ativan helped recently with some edginess when she was in a situation that did not  go as planned.  Family contacts continue to be good. Encouraged patient in practicing more positive behaviors including: Believing more in herself and that she can make significant changes positive for her going forward, remaining in the present focusing on what she can change or control, stay in touch with people who are supportive, looking more for the positives each day versus negatives, healthy nutrition and exercise, positive self talk, reducing overthinking and over analyzing, saying no when she needs to say no without guilt, letting go of things in the past that hold her back now, and recognize the strengths she shows working with goal directed behaviors to move in a direction that supports her improved emotional health.  Review and progress/challenges noted with patient.  Asked appointment within 2 to 3 weeks.  This record has been created using [employer].  Chart creation errors have been sought, but may not always have been located and corrected.  Such creation errors do not reflect on the standard of medical care provided.   Mathis Fare, LCSW

## 2022-03-20 ENCOUNTER — Ambulatory Visit (INDEPENDENT_AMBULATORY_CARE_PROVIDER_SITE_OTHER): Payer: BC Managed Care – PPO | Admitting: Psychiatry

## 2022-03-20 ENCOUNTER — Telehealth: Payer: Self-pay | Admitting: Adult Health

## 2022-03-20 DIAGNOSIS — F411 Generalized anxiety disorder: Secondary | ICD-10-CM

## 2022-03-20 NOTE — Telephone Encounter (Signed)
Called and spoke with patient.

## 2022-03-20 NOTE — Progress Notes (Signed)
[facility] Counselor/Therapist Progress Note  Patient ID: Jessica Gray, MRN: [id number],    Date: 03/20/2022  Time Spent: 55 minutes   Treatment Type: Individual Therapy  Reported Symptoms: anxiety, some depression "but improved"; Irritability increased, frustrated, trouble focusing  Mental Status Exam:  Appearance:   Neat     Behavior:  Appropriate, Sharing, Motivated, and Irritable  Motor:  Normal  Speech/Language:   Clear and Coherent  Affect:  Anxious, irritable  Mood:  anxious, depressed, and irritable  Thought process:  goal directed  Thought content:    overthinking  Sensory/Perceptual disturbances:    WNL  Orientation:  oriented to person, place, time/date, situation, day of week, month of year, year, and stated date of March 20, 2022  Attention:  Fair  Concentration:  Good and Fair  Memory:  WNL  Fund of knowledge:   Good  Insight:    Good and Fair  Judgment:   Good  Impulse Control:  Good   Risk Assessment: Danger to Self:  No Self-injurious Behavior: No Danger to Others: No Duty to Warn:no Physical Aggression / Violence:No  Access to Firearms a concern: No  Gang Involvement:No   Subjective:   Patient in today reporting anxiety, irritability, some depression but improved, frustrated, and difficulty focusing. Very frustrated with Public house manager and patient's new puppy due to having bad experience "and hard moving beyond that."  Anxiety, depression, and irritability all seems to be related to personal issues, work issues, new Holiday representative issues.Some tearfulness that she says is due to frustration. Still easy to look for negatives vs positives. Is taking a week of vacation next week which she feels may be helpful to her right now. May go back to the gym. Walking dog more. To make more contact with friends and plan some activities to get out of the house. Working from home keeps her at home a lot more. Recognizing this is not helpful to be home that  much each day and committed to find some other activities out of the home to be involved.Not as self-critical. Self talk becoming more positive. Energy some better.  Interventions: Cognitive Behavioral Therapy, Solution-Oriented/Positive Psychology, and Ego-Supportive  Treatment goals: Treatment goals remain on treatment plan as patient works with strategies to achieve her goals.  Progress is assessed each session and documented in the "subject" and/or "plan" section of treatment note. Long-term goal: Elevate mood and show evidence of usual energy, activities, and socialization level. Short-term goal: Verbalize an understanding of the relationship between repressed anger and depressed mood. Strategies: Identify cognitive self-talk that is engaged in and supports depression and work to change the self-talk to be more positive and proactive.   Diagnosis:   ICD-10-CM   1. Generalized anxiety disorder  F41.1      Plan: Patient today showing motivation and active participation in session working on her anxiety, some depression which has improved, her station and irritability.  Denies any SI.  Several things contributing to patient's symptoms which she realizes as noted above.  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  Patient is to find out more information about the church she is visited especially in terms of programming and offerings and schedules.  Already knows 1 person that goes there and she agreed to reach out to that person to get more information and maybe connect with him in person.  Considering returning to the gym.  Also recognizing the need to plan more things socially and get out of the house more since so much time is spent there with work and living there.  Tough time and working through her thoughts and feelings today but ended up more motivated and more determined as we  discussed some strategies that can help including the above. Encouraged patient in her practice of more positive behaviors including more positive self talk and self-care, believing in herself that she can make significant changes for the positive and going forward, staying in the present focusing on what she can change or control, remain in touch with people who are supportive, looking more for the positives each day versus negatives, healthy nutrition and exercise, reducing overthinking and over analyzing, saying no when she needs to say no without guilt, letting go of things in the past that hold her back now, and realize the strength she shows working with goal directed behaviors to move in a direction that supports her improved emotional health.  Goal review and progress/challenges noted with patient.  Next appointment within 2 to 3 weeks.  This record has been created using [employer].  Chart creation errors have been sought, but may not always have been located and corrected.  Such creation errors do not reflect on the standard of medical care provided.    Mathis Fare, LCSW

## 2022-03-20 NOTE — Telephone Encounter (Signed)
LVM to RC. Stated was doing better with addition of Wellbutrin at last visit.

## 2022-03-20 NOTE — Telephone Encounter (Signed)
Pt LVM @ 11:17a.   She said she need's Gina's help with her irritability.  It's getting worse.  Next appt 9/5

## 2022-03-20 NOTE — Telephone Encounter (Signed)
Patient was in the office today to see DD. She said she is more irritable lately. She feels part of it may be hormones. She was told recently she is not perimenopausal. She said today she couldn't get signed up for medical benefits via the instructions given and said all she could do was cry. She rates her depression as 5/10 and anxiety 8/10. She is sleeping well. She said she had a new puppy and potty training is going well, but puppy wants to bite her ankles all the time. She did not voice any other stressors. 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 When asked if she was able to get her work done she said she was, but it might be later in the day. I asked if she looked for things to do instead of work and she said she did - doing laundry, playing with puppy, etc. She can't take hormones because she is on Eliquis for blood clots. She sleeps well.

## 2022-04-04 ENCOUNTER — Ambulatory Visit: Payer: BC Managed Care – PPO | Admitting: Psychiatry

## 2022-04-04 ENCOUNTER — Encounter: Payer: Self-pay | Admitting: Adult Health

## 2022-04-04 ENCOUNTER — Ambulatory Visit: Payer: BC Managed Care – PPO | Admitting: Adult Health

## 2022-04-04 DIAGNOSIS — F411 Generalized anxiety disorder: Secondary | ICD-10-CM | POA: Diagnosis not present

## 2022-04-04 DIAGNOSIS — F331 Major depressive disorder, recurrent, moderate: Secondary | ICD-10-CM

## 2022-04-04 MED ORDER — BUPROPION HCL ER (XL) 150 MG PO TB24: ORAL_TABLET | ORAL | 1 refills | Status: AC

## 2022-04-04 MED ORDER — SERTRALINE HCL 100 MG PO TABS: ORAL_TABLET | ORAL | 1 refills | Status: AC

## 2022-04-04 NOTE — Progress Notes (Signed)
[name] [id number] Apr 07, 1976 46 y.o.  Subjective:   Patient ID:  Jessica Gray is a 46 y.o. (DOB May 25, 1976) female.  Chief Complaint: No chief complaint on file.   HPI MICHON KACZMAREK presents to the office today for follow-up of GAD and MDD.  Previously seen by Dr. Donell Beers.  Describes mood today as "ok". Pleasant. Tearful at times. Mood symptoms - reports decreased depression, anxiety and irritability - "some better". Reports decreased worry. Reports rumination - "not letting things go". Reports over thinking. Has a difficult time accepting things - "holding on to things when she should let them go". Not feeling as overwhelmed - "not so much". Stating "I'm feeling stuck". Feels like the medications are helpful. Improved interest and motivation. Taking medications as prescribed.  Energy levels a little better. Active, does not have a regular exercise routine - hip pain is better. Walking dog. Riding stationary bike. Enjoys some usual interests and activities. Divorced. No children. Lives alone with dog - "Sadie" - 6 months old - Labradoodle. Spending time with family and friends. Talking to mother daily. Appetite adequate. Weight stable. Sleeps well most nights. Averages 6 to 7 hours. Focus and concentration stable. Completing tasks. Managing aspects of household. Works as a Physicist, medical Denies SI or HI.  Denies AH or VH.  Previous medication trials:  multiple trials    Flowsheet Row ED from 12/22/2021 in [facility] [place] [facility]  C-SSRS RISK CATEGORY No Risk        Review of Systems:  Review of Systems  Musculoskeletal:  Negative for gait problem.  Neurological:  Negative for tremors.  Psychiatric/Behavioral:         Please refer to HPI    Medications: I have reviewed the patient's current medications.  Current Outpatient Medications  Medication Sig Dispense Refill   buPROPion (WELLBUTRIN XL) 150 MG 24 hr tablet Take three tablets  every morning. 270 tablet 1   LORazepam (ATIVAN) 0.5 MG tablet Take 1 tablet (0.5 mg total) by mouth 2 (two) times daily as needed. 30 tablet 2   sertraline (ZOLOFT) 100 MG tablet Take two tablets daily. 180 tablet 1   No current facility-administered medications for this visit.    Medication Side Effects: None  Allergies: No Known Allergies  Past Medical History:  Diagnosis Date   Interstitial cystitis     Past Medical History, Surgical history, Social history, and Family history were reviewed and updated as appropriate.   Please see review of systems for further details on the patient's review from today.   Objective:   Physical Exam:  There were no vitals taken for this visit.  Physical Exam Constitutional:      General: She is not in acute distress. Musculoskeletal:        General: No deformity.  Neurological:     Mental Status: She is alert and oriented to person, place, and time.     Coordination: Coordination normal.  Psychiatric:        Attention and Perception: Attention and perception normal. She does not perceive auditory or visual hallucinations.        Mood and Affect: Mood normal. Mood is not anxious or depressed. Affect is not labile, blunt, angry or inappropriate.        Speech: Speech normal.        Behavior: Behavior normal.        Thought Content: Thought content normal. Thought content is not paranoid or delusional. Thought content does not  include homicidal or suicidal ideation. Thought content does not include homicidal or suicidal plan.        Cognition and Memory: Cognition and memory normal.        Judgment: Judgment normal.     Comments: Insight intact     Lab Review:  No results found for: "NA", "K", "CL", "CO2", "GLUCOSE", "BUN", "CREATININE", "CALCIUM", "PROT", "ALBUMIN", "AST", "ALT", "ALKPHOS", "BILITOT", "GFRNONAA", "GFRAA"  No results found for: "WBC", "RBC", "HGB", "HCT", "PLT", "MCV", "MCH", "MCHC", "RDW", "LYMPHSABS", "MONOABS",  "EOSABS", "BASOSABS"  No results found for: "POCLITH", "LITHIUM"   No results found for: "PHENYTOIN", "PHENOBARB", "VALPROATE", "CBMZ"   .res Assessment: Plan:    Plan:  PDMP reviewed  Zoloft 150mg  daily Wellbutrin XL 300mg  daily - denies seizure history. Ativan 0.5mg  BID as needed  Time spent with patient was 20 minutes. Greater than 50% of face to face time with patient was spent on counseling and coordination of care.    RTC 2 months  Patient advised to contact office with any questions, adverse effects, or acute worsening in signs and symptoms.  Diagnoses and all orders for this visit:  Generalized anxiety disorder -     sertraline (ZOLOFT) 100 MG tablet; Take two tablets daily. -     buPROPion (WELLBUTRIN XL) 150 MG 24 hr tablet; Take three tablets every morning.  Major depressive disorder, recurrent episode, moderate (HCC) -     sertraline (ZOLOFT) 100 MG tablet; Take two tablets daily. -     buPROPion (WELLBUTRIN XL) 150 MG 24 hr tablet; Take three tablets every morning.     Please see After Visit Summary for patient specific instructions.  Future Appointments  Date Time Provider Department Center  04/04/2022  4:00 PM , LCSW [facility] None  04/18/2022  4:00 PM Mathis Fare, LCSW [facility] None  05/02/2022  4:00 PM Mathis Fare, LCSW [facility] None    No orders of the defined types were placed in this encounter.   -------------------------------

## 2022-04-04 NOTE — Progress Notes (Signed)
[facility] Counselor/Therapist Progress Note  Patient ID: Jessica Gray, MRN: [id number],    Date: 04/04/2022  Time Spent: 55 minutes   Treatment Type: Individual Therapy  Reported Symptoms:    Mental Status Exam:  Appearance:   Neat     Behavior:  Appropriate, Sharing, and Motivated  Motor:  Normal  Speech/Language:   Clear and Coherent  Affect:  anxious  Mood:  anxious  Thought process:  goal directed  Thought content:    Some overthinking  Sensory/Perceptual disturbances:    WNL  Orientation:  oriented to person, place, time/date, situation, day of week, month of year, year, and stated date of Sept 5, 2023  Attention:  Good  Concentration:  Good  Memory:  WNL  Fund of knowledge:   Good  Insight:    Good  Judgment:   Good  Impulse Control:  Good   Risk Assessment: Danger to Self:  No Self-injurious Behavior: No Danger to Others: No Duty to Warn:no Physical Aggression / Violence:No  Access to Firearms a concern: No  Gang Involvement:No   Subjective:  Patient in today and reports symptoms of anxiety and frustration due to personal issues which she shared more of and processed in session today. More difficulty in "letting things go, and accepting what is" and in "looking ahead and expecting worse case scenarios." Resentment from work situation earlier and how her position was changed some. Feels like she has a way of hanging onto hurts from the past and worked more today on strategies for letting go and moving forward, realizing hanging on to old hurts and disappointments holds her back, and practice more positive self-talk and self-care.  Encouraged to return to a local gym, walking her dog more often, consider going with others to a local dog park that would be good socialization for both her dog and herself.  Does acknowledge that she spends too much time at home and needs to get out more.   Interventions: Cognitive Behavioral Therapy and  Ego-Supportive  Treatment goals: Treatment goals remain on treatment plan as patient works with strategies to achieve her goals.  Progress is assessed each session and documented in the "subject" and/or "plan" section of treatment note. Long-term goal: Elevate mood and show evidence of usual energy, activities, and socialization level. Short-term goal: Verbalize an understanding of the relationship between repressed anger and depressed mood. Strategies: Identify cognitive self-talk that is engaged in and supports depression and work to change the self-talk to be more positive and proactive.   Diagnosis:   ICD-10-CM   1. Generalized anxiety disorder  F41.1      Plan:  Patient in today showing active participation and good motivation in session as she focused on not holding onto old hurts from the past and accepting what is, and becoming more willing to work on letting go and using some strategies for moving forward.  Also encouraged more positive self talk and self-care, and paying attention to her positives and not focusing on the negatives.  Worked more intently today on her tendency to dwell on the negatives about herself and about situations rather than accepting situations and working to move forward in a better direction.  Wants to visit 2 more churches and plans to do so within the next couple of weeks. Encouraged patient in the practice of more positive behaviors including: More positive self-care and self-talk, believing in herself that she can make significant changes for the better and going forward, staying in  the present focusing on what she can control or change, remaining in touch with people who are supportive, looking more for the positives each day versus the negatives, healthy nutrition and exercise, reducing overthinking and over analyzing, saying no when she needs to say no without guilt, letting go of things in the past that hold her back now, and recognize the strength she shows  working with goal directed behaviors to move in a direction that supports her improved emotional health and overall outlook.  Goal review and progress/challenges noted with patient.  Next appointment within 2 to 3 weeks.  This record has been created using [employer].  Chart creation errors have been sought, but may not always have been located and corrected.  Such creation errors do not reflect on the standard of medical care provided.   Mathis Fare, LCSW

## 2022-04-18 ENCOUNTER — Ambulatory Visit: Payer: BC Managed Care – PPO | Admitting: Psychiatry

## 2022-04-18 DIAGNOSIS — F411 Generalized anxiety disorder: Secondary | ICD-10-CM | POA: Diagnosis not present
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
## 2022-05-02 ENCOUNTER — Ambulatory Visit (INDEPENDENT_AMBULATORY_CARE_PROVIDER_SITE_OTHER): Payer: Self-pay | Admitting: Psychiatry

## 2022-05-02 DIAGNOSIS — F411 Generalized anxiety disorder: Secondary | ICD-10-CM

## 2022-05-02 NOTE — Progress Notes (Signed)
Patient ID: Jessica Gray, female   DOB: Mar 10, 1976, 46 y.o.   MRN: [id number] Patient no-showed for this appt. Did not call to cancel.
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
## 2022-05-16 ENCOUNTER — Ambulatory Visit (INDEPENDENT_AMBULATORY_CARE_PROVIDER_SITE_OTHER): Payer: BC Managed Care – PPO | Admitting: Psychiatry

## 2022-05-16 DIAGNOSIS — F411 Generalized anxiety disorder: Secondary | ICD-10-CM

## 2022-05-16 NOTE — Progress Notes (Signed)
[facility] Counselor/Therapist Progress Note  Patient ID: Jessica Gray, MRN: [id number],    Date: 05/16/2022  Time Spent: 55 minutes  Treatment Type: Individual Therapy  Reported Symptoms: anxiety, frustrations, irritable "at times"  Mental Status Exam:  Appearance:   Neat     Behavior:  Appropriate, Sharing, and Motivated  Motor:  Normal  Speech/Language:   Clear and Coherent  Affect:  anxious  Mood:  anxious  Thought process:  goal directed  Thought content:    Some overthinking  Sensory/Perceptual disturbances:    WNL  Orientation:  oriented to person, place, time/date, situation, day of week, month of year, year, and stated date of Oct. 17. 2023  Attention:  Good  Concentration:  Good  Memory:  WNL  Fund of knowledge:   Good  Insight:    Good and Fair  Judgment:   Good  Impulse Control:  Good   Risk Assessment: Danger to Self:  No Self-injurious Behavior: No Danger to Others: No Duty to Warn:no Physical Aggression / Violence:No  Access to Firearms a concern: No  Gang Involvement:No   Subjective:  Patient in today reporting symptoms of anxiety, frustration, irritability, and continues to have problems with her dog biting. Don't feel I fit in at times at church and other places. Has started attending a church and has gone 4 times and has experienced  other people there speaking to her. Trying to manage "pent up anger" and allow herself to let go of the anger rather than holding onto it. Finds that letting herself cry helps, walking helps. Explored with patient her feelings and her wanting to check into more pharmacist she knows re: what "natural products" in addition to regular meds can help if I'm having hormonal issues.Wondering if some of her mood symptoms might  be due to peri-menopausal and plans to reach out to her doctor. Working on Clinical research associate of activities at her church as she know she needs more social time with others "and that would be a good place  to be with others."Considering joining a gym and going back to church. Some financial stress but less than it was months ago. Admits being single "gets me down sometimes" and does feel that locating more activities for her to be involved in with others is a good thing and commits to following through on that between now and next session.  Work situation still somewhat stressful and not what patient had hoped for longer term.  Does commit to talking further about the resentment issues surrounding former boyfriend as it has "piggybacked her" and has remained her strongest resentment per patient report.  Plan to follow-up with this at next session.  Recognizes that it is easy for her to latch onto negatives and hang onto them and is wanting that to stop.  Interventions: Cognitive Behavioral Therapy and Solution-Oriented/Positive Psychology  Treatment goals: Treatment goals remain on treatment plan as patient works with strategies to achieve her goals.  Progress is assessed each session and documented in the "subject" and/or "plan" section of treatment note. Long-term goal: Elevate mood and show evidence of usual energy, activities, and socialization level. Short-term goal: Verbalize an understanding of the relationship between repressed anger and depressed mood. Strategies: Identify cognitive self-talk that is engaged in and supports depression and work to change the self-talk to be more positive and proactive  Diagnosis:   ICD-10-CM   1. Generalized anxiety disorder  F41.1      Plan:  Patient in  today showing active participation and good motivation in session as she focused on her irritability, anxiety, and frustration as well as some long-term resentment that we did not get much time to do with today due to running out of time for session and her having some other priorities to assess first.  Was much more open today and describing some of her struggles especially some that may be hormonal (not  sure) but more importantly some struggles around the fact she is so isolated in some ways as she works primarily from home and has not had much regular interaction with people in some time.  Has been out of church but just recently got back involved with a church and has attended 4 times and plans to keep attending.  Has not really branched out into their activities yet but made a commitment today that over the next week she is going to be in contact or go online and find out there activity schedule and look for offerings that may be of interest to her and would also be a good way to meet more people there as she feels like it is a good fit for her as far as the church itself.  Mood did seem more elevated in a positive way by the end of session.  Increased commitment to goal-directed behaviors.  Wanting to recognize "what I can change and what I cannot change".  Better motivation and seemed more committed to therapy goals. Encouraged patient in her practice of more positive behaviors as noted in sessions including: Believing in herself that she can continue making significant changes trying to move forward in a healthier direction mentally/emotionally/spiritually/and physically, practice more positive self-care and self talk, stay in the present focusing on what she can truly change in control, remain in touch with people who are supportive of her, look for more positives versus negatives each day, healthy nutrition and exercise, overthinking and overanalyzing, saying no when she needs to say no without guilt, letting go of things in the past that can hold her back now, and recognize the strength she shows working with goal-directed behaviors to move in a direction that supports her improved emotional health and overall wellbeing.  Goal review and progress/challenges noted with patient.  Next appointment within 2 weeks.   This record has been created using [employer].  Chart creation errors have been  sought, but may not always have been located and corrected.  Such creation errors do not reflect on the standard of medical care provided.   Shanon Ace, LCSW

## 2022-05-31 ENCOUNTER — Ambulatory Visit: Payer: BC Managed Care – PPO | Admitting: Psychiatry

## 2022-05-31 ENCOUNTER — Ambulatory Visit: Payer: BC Managed Care – PPO | Admitting: Adult Health

## 2022-05-31 ENCOUNTER — Encounter: Payer: Self-pay | Admitting: Adult Health

## 2022-05-31 DIAGNOSIS — F411 Generalized anxiety disorder: Secondary | ICD-10-CM | POA: Diagnosis not present

## 2022-05-31 NOTE — Progress Notes (Signed)
[facility] Counselor/Therapist Progress Note  Patient ID: Jessica Gray, MRN: [id number],    Date: 05/31/2022  Time Spent: 55 minutes   Treatment Type: Individual Therapy  Reported Symptoms: anxiety, depression, irritability,tearfulness, agitation  Mental Status Exam:  Appearance:   Neat     Behavior:  Appropriate, Sharing, and Motivated  Motor:  Normal  Speech/Language:   Clear and Coherent  Affect:  Depressed and anxiety  Mood:  anxious and depressed  Thought process:  goal directed  Thought content:    Rumination  Sensory/Perceptual disturbances:    WNL  Orientation:  oriented to person, place, time/date, situation, day of week, month of year, year, and stated date of Nov. 1, 2023  Attention:  Good  Concentration:  Good  Memory:  WNL  Fund of knowledge:   Good  Insight:    Good  Judgment:   Good  Impulse Control:  Good   Risk Assessment: Danger to Self:  No Self-injurious Behavior: No Danger to Others: No Duty to Warn:no Physical Aggression / Violence:No  Access to Firearms a concern: No  Gang Involvement:No   Subjective:  Patient in today reporting more irritability, tearfulness, anxiety, agitation, depression. Thinks it's hormonal and plans to see doctor at [facility] on Nov. 6th. To work with Armed forces operational officer later in month as dog continues biting. Planning to go on trip with her mom tomorrow for a few days. Worked with patient today on her anxiety, irritability, agitation, and depression, and patient states "I do feel they are all related." Shared her thought patterns, what really stresses her the most as well as what seems to help her feel more peace, less frustration and agitation. States that she has found "getting outside some, letting myself have a "timeout" or a "crying out and just let myself cry can be helpful", and listening to faith-based app on her phone.  Would like eventually to meet someone and perhaps date but not quite ready for that yet.  Does  admit that being single can be depressing.  Work remains stressful.  Recognizes that some older "resentment issues" in regards to her former boyfriend continues to play out in her head at times and will plan to bring this up and next session.  Acknowledges that she does tend to sometimes hold on to negatives and realizes that that is self-defeating for her, although has had a hard time stopping.  Considering joining a gym at some point.  Trying not to let anger as frustration build up and find healthier ways of expressing it.  She spoke about her faith and how she is wanting to use it more in healing and we talked about some strategies where she can easily incorporate her faith and I do think that is one of her strength areas.  She has found a church where she has visited now at least for Sundays and is feeling like this is a church that she could fit in and be involved in some activities that would be helpful socially and spiritually.  Interventions: Cognitive Behavioral Therapy and Ego-Supportive  Treatment goal plan: Patient not signing treatment goal plan on computer screen due to COVID concerns. Treatment goals: Treatment goals remain on treatment plan as patient works with strategies to achieve her goals.  Progress is assessed each session and documented in the "subject" and/or "plan" section of treatment note. Long-term goal: Elevate mood and show evidence of usual energy, activities, and socialization level. Short-term goal: Verbalize an understanding of  the relationship between repressed anger and depressed mood. Strategies: Identify cognitive self-talk that is engaged in and supports depression and work to change the self-talk to be more positive and proactive   Diagnosis:   ICD-10-CM   1. Generalized anxiety disorder  F41.1      Plan:  Patient in today actively participating in session focusing on her symptoms of anxiety, irritability, agitation, depression, and tearfulness.  Still  wonders if some of it is hormonal and is having that checked out next week at a doctor's appointment.  Still struggling with getting her dog appropriately trained and to stop biting her primarily.  Is consulting with a dog trainer a little later this month.  Patient showed a lot of good insight and strength in session today working on the above named issues.  Does have some "homework" between sessions to work on to which she agreed and are regarding her resentment issues from former boyfriend, her recognizing and need for closer relationships and perhaps eventually another dating relationship, believing in herself more, healthier ways of expressing frustration and anger, feeling better about herself, increased commitment to her goal-directed behaviors, and following up on some of the strategies we spoke about today where she can use her faith more in her emotional healing.Encouraged patient in practicing more positive behaviors as noted in session including: Believing more in herself that she can continue making significant changes trying to move forward in a healthier direction mentally/emotionally/spiritually/and physically, practice more positive self-care and self talk, remain in the present focusing on what she can truly change or control, stay in touch with people who are supportive of her, look for more positives versus negatives daily, healthy nutrition and exercise, decrease her overthinking and over analyzing, saying no when she needs to say no without guilt, letting go of things in the past that can hold her back now, pursue her desires of finding a church that might feel like a good fit for her, and realize the strength she shows working with goal-directed behaviors to move in a direction that supports her improved emotional health and outlook.  Goal review and progress/challenges noted with patient.  Next appointment within 2 to 3 weeks.  This record has been created using [employer].  Chart  creation errors have been sought, but may not always have been located and corrected.  Such creation errors do not reflect on the standard of medical care provided.   Shanon Ace, LCSW

## 2022-05-31 NOTE — Progress Notes (Signed)
Jessica Gray [id number] 24-Jan-1976 47 y.o.  Subjective:   Patient ID:  Jessica Gray is a 46 y.o. (DOB 1976-02-17) female.  Chief Complaint: No chief complaint on file.   HPI Jessica Gray presents to the office today for follow-up of GAD and MDD.  Previously seen by Dr. Casimiro Needle.  Describes mood today as "ok". Pleasant. Tearful at times. Mood symptoms - reports depression, anxiety and irritability - "some days are better than others". Reports some worry, rumination and over thinking. Difficulties letting things go. Stating "I'm doing alright for the most part". Feels like the medications are helpful. Improved interest and motivation. Taking medications as prescribed.  Energy levels a little better. Active, does not have a regular exercise routine - hip pain is better. Walking dog. Plans to start going to [employer] more regularly. Enjoys some usual interests and activities. Divorced. No children. Lives alone with dog - "Sadie" - Labradoodle. Spending time with family and friends. Talking to mother daily. Appetite adequate. Weight stable. Sleeps well most nights. Averages 6 to 7 hours. Focus and concentration stable. Completing tasks. Managing aspects of household. 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  Denies AH or VH.  Previous medication trials:  multiple trials   Flowsheet Row ED from 12/22/2021 in [place] No Risk        Review of Systems:  Review of Systems  Musculoskeletal:  Negative for gait problem.  Neurological:  Negative for tremors.  Psychiatric/Behavioral:         Please refer to HPI    Medications: I have reviewed the patient's current medications.  Current Outpatient Medications  Medication Sig Dispense Refill   buPROPion (WELLBUTRIN XL) 150 MG 24 hr tablet Take three tablets every morning. 270 tablet 1   LORazepam (ATIVAN) 0.5 MG tablet Take 1 tablet (0.5 mg total) by mouth 2  (two) times daily as needed. 30 tablet 2   sertraline (ZOLOFT) 100 MG tablet Take two tablets daily. 180 tablet 1   No current facility-administered medications for this visit.    Medication Side Effects: None  Allergies: No Known Allergies  Past Medical History:  Diagnosis Date   Interstitial cystitis     Past Medical History, Surgical history, Social history, and Family history were reviewed and updated as appropriate.   Please see review of systems for further details on the patient's review from today.   Objective:   Physical Exam:  There were no vitals taken for this visit.  Physical Exam Constitutional:      General: She is not in acute distress. Musculoskeletal:        General: No deformity.  Neurological:     Mental Status: She is alert and oriented to person, place, and time.     Coordination: Coordination normal.  Psychiatric:        Attention and Perception: Attention and perception normal. She does not perceive auditory or visual hallucinations.        Mood and Affect: Mood normal. Mood is not anxious or depressed. Affect is not labile, blunt, angry or inappropriate.        Speech: Speech normal.        Behavior: Behavior normal.        Thought Content: Thought content normal. Thought content is not paranoid or delusional. Thought content does not include homicidal or suicidal ideation. Thought content does not include homicidal or suicidal plan.  Cognition and Memory: Cognition and memory normal.        Judgment: Judgment normal.     Comments: Insight intact     Lab Review:  No results found for: "NA", "K", "CL", "CO2", "GLUCOSE", "BUN", "CREATININE", "CALCIUM", "PROT", "ALBUMIN", "AST", "ALT", "ALKPHOS", "BILITOT", "GFRNONAA", "GFRAA"  No results found for: "WBC", "RBC", "HGB", "HCT", "PLT", "MCV", "MCH", "MCHC", "RDW", "LYMPHSABS", "MONOABS", "EOSABS", "BASOSABS"  No results found for: "POCLITH", "LITHIUM"   No results found for: "PHENYTOIN",  "PHENOBARB", "VALPROATE", "CBMZ"   .res Assessment: Plan:    Plan:  PDMP reviewed  Zoloft 100mg  - 2 daily Wellbutrin XL 150 mg daily - 3 tablets daily - denies seizure history. Ativan 0.5mg  BID as needed  Plans to follow up with [facility] weight [facility] center  Time spent with patient was 20 minutes. Greater than 50% of face to face time with patient was spent on counseling and coordination of care.    RTC 3 months  Patient advised to contact office with any questions, adverse effects, or acute worsening in signs and symptoms.   Diagnoses and all orders for this visit:  Generalized anxiety disorder     Please see After Visit Summary for patient specific instructions.  Future Appointments  Date Time Provider [place]  05/31/2022  4:00 PM Shanon Ace, LCSW [facility] None    No orders of the defined types were placed in this encounter.   -------------------------------

## 2022-06-20 ENCOUNTER — Encounter: Payer: Self-pay | Admitting: Psychiatry

## 2022-06-20 ENCOUNTER — Ambulatory Visit: Payer: BC Managed Care – PPO | Admitting: Psychiatry

## 2022-06-20 DIAGNOSIS — F411 Generalized anxiety disorder: Secondary | ICD-10-CM

## 2022-06-20 NOTE — Progress Notes (Signed)
[facility] Counselor/Therapist Progress Note  Patient ID: Jessica Gray, MRN: [id number],    Date: 06/20/2022  Time Spent: 55 minutes   Treatment Type: Individual Therapy  Reported Symptoms:  anxiety, some depression  Mental Status Exam:  Appearance:   Neat     Behavior:  Appropriate, Sharing, and Motivated  Motor:  Normal  Speech/Language:   Clear and Coherent  Affect:  anxiety  and some depression  Mood:  anxious and depressed  Thought process:  goal directed  Thought content:    Decrease in obsessive thoughts  Sensory/Perceptual disturbances:    WNL  Orientation:  oriented to person, place, time/date, situation, day of week, month of year, year, and stated date of Nov. 21, 2023  Attention:  Fair  Concentration:  Good and Fair  Memory:  WNL  Fund of knowledge:   Good  Insight:    Good  Judgment:   Good  Impulse Control:  Good   Risk Assessment: Danger to Self:  No Self-injurious Behavior: No Danger to Others: No Duty to Warn:no Physical Aggression / Violence:No  Access to Firearms a concern: No  Gang Involvement:No   Subjective:  Patient in today reporting anxiety, some depression, some irritability, agitation. Thought some symptoms were hormonal but was checked out at [facility] [place] and did not feel her symptoms were hormonal "but did tell me I needed to lower my triglycerides and cholesterol (Is checking other resources before going further.) Today, working further on her anxiety, depression, irritability, and some agitation, mostly related to personal, family, and work stressors. Processed her thoughts and feelings regarding her symptoms, reporting she most frequently feels irritability,anxiety, and depression the most often and tags them as being more related to personal issues with which she is currently trying to manage in healthier ways including relaxation, practicing the "pause", reframing, giving herself a "timeout" increased self-understanding and care,  more positive/encouraging self-care, and more persistence in her goal-directed behaviors.   Trying to manage frustration and anger in healthier ways rather than let it continue to build up inside of her.  Also using her faith to help her in her goal-directed behaviors, trying not to hold onto the negatives as this is very self-defeating for patient.  Interventions: Cognitive Behavioral Therapy and Ego-Supportive  Long-term goal: Elevate mood and show evidence of usual energy, activities, and socialization level. Short-term goal: Verbalize an understanding of the relationship between repressed anger and depressed mood. Strategies: Identify cognitive self-talk that is engaged in and supports depression and work to change the self-talk to be more positive and proactive   Diagnosis:   ICD-10-CM   1. Generalized anxiety disorder  F41.1      Plan:  Patient today showing active participation working on her anger, frustration, depression, and irritability as she does feel these are all closely connected and has made some progress already as she noted.  Wanting to build on this as this also seems to be helping her self-esteem.  Is getting better at postponing certain reactions and being able to actually think through situations taking the time to pause and reframe or given herself a timeout that usually leads to better actions and behavior on her part.  Discussed several examples today of where this is occurred.  Improvement in her patience and being able to step back and think sometimes before speaking or acting.  Continues to work with her dog and training exercises and the trainer does feel her dog will be able to make  more progress which is a relief to patient.  Loves having a dog.  Completes homework between sessions whether it be something written or behavioral.  Does want to focus more on some resentment issues regarding former boyfriend, the need for closer relationships and may be at some point  another dating relationship, believe more in herself, continued healthier expressions of frustrations and anger, improved self-esteem, as she continues her commitment to goal-directed behaviors and a sincere desire to move emotionally.  Her faith continues to be an important part of her journey.  Is feeling good that she has found a church now that she feels is a good fit and has attended multiple Sundays. Encouraged patient in her practice of more positive behaviors as noted in session including: Believing in herself more that she can continue making significant changes in order to move forward in a healthier direction emotionally/mentally/spiritually/and physically, positive self-care and positive self talk, stay in the present focusing on what she can change her control, remain in touch with people who are supportive of her, search for more positives versus negatives each day, healthy nutrition and exercise, decrease her overthinking and overanalyzing, saying no when she needs to say no without guilt, letting go of things in the past that can hold her back now, pursue her desires of finding a church that might feel like a good fit for her and she has begun visiting 1, and recognize the strength she shows working with goal-directed behaviors to move in a direction that supports her improved emotional health.         Goal review and progress/challenges noted with patient.  Next appointment within 3 weeks.  This record has been created using [employer].  Chart creation errors have been sought, but may not always have been located and corrected.  Such creation errors do not reflect on the standard of medical care provided.   Mathis Fare, LCSW

## 2022-07-11 ENCOUNTER — Ambulatory Visit: Payer: BC Managed Care – PPO | Admitting: Psychiatry

## 2022-07-11 DIAGNOSIS — F411 Generalized anxiety disorder: Secondary | ICD-10-CM | POA: Diagnosis not present

## 2022-07-11 NOTE — Progress Notes (Signed)
[facility] Counselor/Therapist Progress Note  Patient ID: Jessica Gray, MRN: [id number],    Date: 07/11/2022  Time Spent: 50 minutes   Treatment Type: Individual Therapy  Reported Symptoms:  anxiety, irritability, frustration that her hormonal meds not working effectively  Mental Status Exam:  Appearance:   Casual     Behavior:  Appropriate, Sharing, and Motivated  Motor:  Normal  Speech/Language:   Clear and Coherent  Affect:  Appropriate  Mood:  anxious and irritable  Thought process:  goal directed  Thought content:    Some obsessive thoughts  Sensory/Perceptual disturbances:    WNL  Orientation:  oriented to person, place, time/date, situation, day of week, month of year, year, and stated date of Dec. 12, 2023  Attention:  Good  Concentration:  Good and Fair  Memory:  WNL  Fund of knowledge:   Good  Insight:    Good  Judgment:   Good  Impulse Control:  Good   Risk Assessment: Danger to Self:  No Self-injurious Behavior: No Danger to Others: No Duty to Warn:no Physical Aggression / Violence:No  Access to Firearms a concern: No  Gang Involvement:No   Subjective: Patient reporting anxiety, irritability, and frustration "about my irritability". "Feeling short tempered and like I can't handle nothing." Still feeling that way some now.  States that she feels most of her symptoms are related to family, personal, and work stressors.  Some days better than others. Still wondering if irritability is hormonal. Tired of her job and the work I do. Hard to have good relationships at work and hard  to build rapport in some environments. Still attending her new church and getting to know some people. Having some physical/hormonal issues and see doctor for consult. Discussed her anxiety, irritability and frustrations in detail today and plans to talk further with her regular doctor and ob-gyn about "hormonal" issues. Working on her recognition of repressed anger and frustration  and how this can impact her mood, and strategies to use to better manage the anger/frustration. Continued frustration and concern about dog she adopted and has become attached to however the dog's behavior issues continue even though she has taken him for dog trainings and evaluation.  Patient describing multiple stressors and continues to work with goal-directed behaviors and stress management strategies including giving herself a timeout, practicing the "pause", and reframing.  Trying not to allow the negatives to outweigh the positives.  Interventions: Cognitive Behavioral Therapy, Solution-Oriented/Positive Psychology, and Ego-Supportive  Long-term goal: Elevate mood and show evidence of usual energy, activities, and socialization level. Short-term goal: Verbalize an understanding of the relationship between repressed anger and depressed mood. Strategies: Identify cognitive self-talk that is engaged in and supports depression and work to change the self-talk to be more positive and proactive  Diagnosis:   ICD-10-CM   1. Generalized anxiety disorder  F41.1      Plan: Patient actively engaged in session today and confronting her continued issues with anxiety, frustration, irritability, and difficulty feeling more positive and maintaining it.  Notices less anger.  Has had multiple stressors going on as noted above and is really thinking that some of her irritability and other mood symptoms are related to hormonal issues.  Has scheduled appointment with her medical doctor to follow-up on this.  Despite the challenges she is having, she remains motivated to work on goal-directed behaviors and to continue making progress although it has not been as fast as she had hoped but does not feel  bad about that as she knows she has faced multiple challenges.  Encouraged patient and her sense of commitment to her goals and to herself, pointing out some of the ways she has made some progress, and her continued  efforts.  Had wanted to focus more on some resentment issues regarding former boyfriend and the need for closer relationships at some point but today stated earlier that she really needed to focus on more of her anxiety and frustration as noted above.  Will follow-up with patient upon her return regarding her request about working on resentment issues regarding former boyfriend. Encouraged patient in practicing more positive behaviors as noted in session including: Staying in the present focusing on what she can change or control, believing more in herself that she can continue making significant changes to move forward in a healthier direction emotionally/mentally/spiritually and physically, positive self talk and positive self-care, stay in touch with people who are supportive of her, search for more positives versus negatives each day, healthy nutrition and exercise, decrease overthinking and over analyzing, letting go of things in the past that can hold her back now, continue to be involved in the church that she found and feels like a good fit, and recognize the strengths that she shows working with goal-directed behaviors to move in a direction that supports her improved emotional health and overall outlook.  Goal review and progress/challenges noted with patient.  Next appt within 3 weeks .  This record has been created using [employer].  Chart creation errors have been sought, but may not always have been located and corrected.  Such creation errors do not reflect on the standard of medical care provided.   Mathis Fare, LCSW

## 2022-08-01 ENCOUNTER — Ambulatory Visit (INDEPENDENT_AMBULATORY_CARE_PROVIDER_SITE_OTHER): Payer: BC Managed Care – PPO | Admitting: Adult Health

## 2022-08-01 ENCOUNTER — Encounter: Payer: Self-pay | Admitting: Adult Health

## 2022-08-01 ENCOUNTER — Ambulatory Visit: Payer: BC Managed Care – PPO | Admitting: Psychiatry

## 2022-08-01 DIAGNOSIS — F331 Major depressive disorder, recurrent, moderate: Secondary | ICD-10-CM | POA: Diagnosis not present

## 2022-08-01 DIAGNOSIS — F411 Generalized anxiety disorder: Secondary | ICD-10-CM

## 2022-08-01 NOTE — Progress Notes (Signed)
[facility] Counselor/Therapist Progress Note  Patient ID: Jessica Gray, MRN: [id number],    Date: 08/01/2022  Time Spent: 52 minutes   Treatment Type: Individual Therapy  Reported Symptoms: depression, frustration, some anxiety and irritability  Mental Status Exam:  Appearance:   Casual     Behavior:  Appropriate, Sharing, and Motivated  Motor:  Normal  Speech/Language:   Clear and Coherent  Affect:  Depressed and some anxiety and frustration, irritability  Mood:  anxious, depressed, irritable, and frustration  Thought process:  goal directed  Thought content:    WNL  Sensory/Perceptual disturbances:    WNL  Orientation:  oriented to person, place, time/date, situation, day of week, month of year, year, and stated date of Jan. 2, 2024  Attention:  Good  Concentration:  Good  Memory:  WNL  Fund of knowledge:   Good  Insight:    Good  Judgment:   Good  Impulse Control:  Good   Risk Assessment: Danger to Self:  No Self-injurious Behavior: No Danger to Others: No Duty to Warn:no Physical Aggression / Violence:No  Access to Firearms a concern: No  Gang Involvement:No   Subjective:  Patient in today reporting anxiety, some depression, frustration, and irritability, and feels short-tempered often. "I need to be more mindful, interrupt my frustration before it gets so bad," and take deep breaths, re-focus/re-group, and be able to walk away or change behavior in some way." Processed this more in session today especially how "I need to not get so worked up or upset as it's hard to stop then." Hard to deal with repressed anger, "things out of my control" which is a significant issue, and worked on this in more detail in session today. "Very stressed with things out of my control at work, with my family, her own mother not following through on goal-directed behaviors, and with her dog she got a few months ago." Discussed each of these in detail and looked at strategies that  could help patient in being more focused and follow through with goal-directed behaviors, with which she agreed and is to use these strategies between now and next session. Enjoys attending her new church and meeting people there.  Remains concerned about some behavioral problems she is still having with the dog she adopted and has gotten some updated tips on some strategies to use with her dog that may help its "responses and guardedness".  Interventions: Cognitive Behavioral Therapy and Ego-Supportive  Long-term goal: Elevate mood and show evidence of usual energy, activities, and socialization level. Short-term goal: Verbalize an understanding of the relationship between repressed anger and depressed mood. Strategies: Identify cognitive self-talk that is engaged in and supports depression and work to change the self-talk to be more positive and proactive   Diagnosis:   ICD-10-CM   1. Major depressive disorder, recurrent episode, moderate ([place])  F33.1      Plan:  Patient showed good motivation and participation in session today working further on her depression, anxiety, irritability, short temperedness, and frustration.  As noted above, she worked very well in session today with increased motivation and realizing she is needing to try some different strategies in the training of her dog so he would not present to be a danger to anyone that might be visiting her.  Also looking more at "things that are out of my control" and accepting that rather than trying to force the change she cannot make.  In addition to what is noted above,  patient also worked on some self-care strategies that will be helpful for her at home and in her job especially in limit setting, and more positive self talk.  A positive for her includes her new her church she is attending and getting more acclimated there.  Continues to work with goal-directed behaviors and is trying to up her commitment to these behaviors to really  follow through and practice them on a regular basis.  Less angry.  Needing to focus next session more on some unresolved issues from former relationship with a previous boyfriend that she realizes is still impacting her today.  To continue work with stress management strategies also to include taking timeouts as needed, reframing, positive self talk, and "practicing the pause". Encouraged patient in her practice of more positive behaviors as discussed in session including: Remaining in the present and focused on what she can control or change, believing more in herself that she can continue making significant changes to move forward in a healthier direction emotionally/mentally/spiritually and physically, positive self talk and self-care, stay in touch with people who are supportive of her, search for more positives versus negatives daily, healthy nutrition and exercise, decrease her overthinking and over analyzing, letting go of things in the past that can hold her back now, continue to be involved in the church that she has found and feels like is a good fit, and recognize the strength she shows working with goal-directed behaviors to move in a direction that supports her overall improved emotional health.  Goal review and progress/challenges noted with patient.  Next appointment within 3 weeks.  This record has been created using [employer].  Chart creation errors have been sought, but may not always have been located and corrected.  Such creation errors do not reflect on the standard of medical care provided.    Shanon Ace, LCSW

## 2022-08-01 NOTE — Progress Notes (Signed)
Jessica Gray [id number] 07/14/1976 47 y.o.  Subjective:   Patient ID:  Jessica Gray is a 47 y.o. (DOB [birth year]) female.  Chief Complaint: No chief complaint on file.   HPI DILCIA RYBARCZYK presents to the office today for follow-up of GAD and MDD.  Previously seen by Dr. Casimiro Needle.  Describes mood today as "not too good". Pleasant. Tearful at times. Mood symptoms - reports depression, anxiety. Reports more irritability overall - "short fused". Reports some worry, rumination and over thinking. Reports ups and downs - "possible hormones". Stating "I'm not sure if the medications are helpful". Would like to taper off current medications and consider other options. Feels like the medications are helpful. Improved interest and motivation. Taking medications as prescribed.  Energy levels "ok". Active, has a regular exercise routine - hip pain. Walking dog 40 minutes a day. Plans to start going to [employer] more regularly. Enjoys some usual interests and activities. Divorced. No children. Lives alone with dog - "Sadie" - Labradoodle. Spending time with family and friends. Talking to mother daily. Appetite adequate. Weight stable. Sleeps well most nights. Averages 6 to 7 hours. Focus and concentration stable. Completing tasks. Managing aspects of household. Works as a Designer, fashion/clothing Denies SI or Gray.  Denies AH or VH.  Previous medication trials:  multiple trials   Flowsheet Row ED from 12/22/2021 in [facility] No Risk        Review of Systems:  Review of Systems  Musculoskeletal:  Negative for gait problem.  Neurological:  Negative for tremors.  Psychiatric/Behavioral:         Please refer to HPI    Medications: I have reviewed the patient's current medications.  Current Outpatient Medications  Medication Sig Dispense Refill   buPROPion (WELLBUTRIN XL) 150 MG 24 hr tablet Take three tablets every morning. 270  tablet 1   LORazepam (ATIVAN) 0.5 MG tablet Take 1 tablet (0.5 mg total) by mouth 2 (two) times daily as needed. 30 tablet 2   sertraline (ZOLOFT) 100 MG tablet Take two tablets daily. 180 tablet 1   No current facility-administered medications for this visit.    Medication Side Effects: None  Allergies: No Known Allergies  Past Medical History:  Diagnosis Date   Interstitial cystitis     Past Medical History, Surgical history, Social history, and Family history were reviewed and updated as appropriate.   Please see review of systems for further details on the patient's review from today.   Objective:   Physical Exam:  There were no vitals taken for this visit.  Physical Exam Constitutional:      General: She is not in acute distress. Musculoskeletal:        General: No deformity.  Neurological:     Mental Status: She is alert and oriented to person, place, and time.     Coordination: Coordination normal.  Psychiatric:        Attention and Perception: Attention and perception normal. She does not perceive auditory or visual hallucinations.        Mood and Affect: Mood normal. Mood is not anxious or depressed. Affect is not labile, blunt, angry or inappropriate.        Speech: Speech normal.        Behavior: Behavior normal.        Thought Content: Thought content normal. Thought content is not paranoid or delusional. Thought content does not include homicidal or suicidal ideation. Thought  content does not include homicidal or suicidal plan.        Cognition and Memory: Cognition and memory normal.        Judgment: Judgment normal.     Comments: Insight intact     Lab Review:  No results found for: "NA", "K", "CL", "CO2", "GLUCOSE", "BUN", "CREATININE", "CALCIUM", "PROT", "ALBUMIN", "AST", "ALT", "ALKPHOS", "BILITOT", "GFRNONAA", "GFRAA"  No results found for: "WBC", "RBC", "HGB", "HCT", "PLT", "MCV", "MCH", "MCHC", "RDW", "LYMPHSABS", "MONOABS", "EOSABS",  "BASOSABS"  No results found for: "POCLITH", "LITHIUM"   No results found for: "PHENYTOIN", "PHENOBARB", "VALPROATE", "CBMZ"   .res Assessment: Plan:    Plan:  PDMP reviewed  Discussed taper off Wellbutrin XL 150 mg daily - 3 tablets daily - denies seizure history.  Zoloft 100mg  - 2 daily Ativan 0.5mg  BID as needed  Plans to follow up with [facility].  Time spent with patient was 20 minutes. Greater than 50% of face to face time with patient was spent on counseling and coordination of care.    RTC 3 months  Patient advised to contact office with any questions, adverse effects, or acute worsening in signs and symptoms.  Diagnoses and all orders for this visit:  Major depressive disorder, recurrent episode, moderate ([facility])  Generalized anxiety disorder     Please see After Visit Summary for patient specific instructions.  Future Appointments  Date Time Provider [place]  08/01/2022  1:00 PM Shanon Ace, LCSW [facility] None  09/05/2022  1:00 PM Shanon Ace, LCSW [facility] None    No orders of the defined types were placed in this encounter.   -------------------------------

## 2022-09-05 ENCOUNTER — Ambulatory Visit: Payer: BC Managed Care – PPO | Admitting: Psychiatry

## 2022-09-05 DIAGNOSIS — F331 Major depressive disorder, recurrent, moderate: Secondary | ICD-10-CM | POA: Diagnosis not present

## 2022-09-05 NOTE — Progress Notes (Signed)
[facility] Counselor/Therapist Progress Note  Patient ID: Jessica Gray, MRN: [id number],    Date: 09/05/2022  Time Spent: 50 minutes   Treatment Type: Individual Therapy  Reported Symptoms:  depression (improved), anxiety (improved), irritability (improved)  Mental Status Exam:  Appearance:   Neat     Behavior:  Appropriate, Sharing, and Motivated  Motor:  Normal  Speech/Language:   Clear and Coherent  Affect:  anxiety  Mood:  anxious  Thought process:  goal directed  Thought content:    WNL  Sensory/Perceptual disturbances:    WNL  Orientation:  oriented to person, place, time/date, situation, day of week, month of year, year, and stated date of Feb. 6, 2024  Attention:  Good  Concentration:  Good  Memory:  WNL  Fund of knowledge:   Good  Insight:    Good  Judgment:   Good  Impulse Control:  Good   Risk Assessment: Danger to Self:  No Self-injurious Behavior: No Danger to Others: No Duty to Warn:no Physical Aggression / Violence:No  Access to Firearms a concern: No  Gang Involvement:No   Subjective:  Patient in today reporting anxiety/depression/irritability but all are improving. Work is biggest stressor currently and patient was able to identify several issues that are challenging in her work environment including: isolation, lack of continuity with clients, lesser time with patients and missing using a lot of her [place] skills. Stated also "sometimes I struggle with just being content with where I am, realizing I can't control everything, and trust that whatever I need to deal with, I can deal with it." "I need to do better about accepting where I'm at, and appreciating the things I do have versus focusing on what I don't have." Processed more with patient about this statement and she is showing a lot of increased self-awareness, intuition, and personal growth, as she continues her work on treatment goals.  Is enjoying attending her new her church and already has  made some good connections there.  Her dog is a real asset for her and she feels more encouraged that the dog is having fewer behavioral problems as they work together with some strategies that can help her manage the dog more effectively.  Reporting a decrease in the following symptoms: Anxiety, depression, short-temperedness, and irritability.  Patient noticing an increase in her mindfulness, self-awareness, being able to refocus, and in her being able to manage frustration better.   Continuing to work on better managing situations when "things are out of my control".    Interventions: Cognitive Behavioral Therapy and Ego-Supportive  Long-term goal: Elevate mood and show evidence of usual energy, activities, and socialization level. Short-term goal: Verbalize an understanding of the relationship between repressed anger and depressed mood. Strategies: Identify cognitive self-talk that is engaged in and supports depression and work to change the self-talk to be more positive and proactive  Diagnosis:   ICD-10-CM   1. Major depressive disorder, recurrent episode, moderate ([place])  F33.1      Plan: Patient today actively participating and motivated in session as she worked further on her anxiety, depression, and irritability which are all improving as noted above.  Focusing more on work stressors as that is very important to patient, as she considers options for her future.  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  Definitely feeling better about herself and making some noticeable progress.  Less anger.  Better  focus.  Plans to continue stress management strategies including taking timeouts as needed, "practicing the pause", reframing, and positive self talk.  Needs to continue working with goal-directed behaviors as she moves forward with more confidence. Encouraged patient and practicing more positive behaviors as discussed in session  including: Staying in the present and focusing on what she can control or change, believing more in herself that she can continue making significant changes to move forward in a healthier direction emotionally/spiritually/mentally and physically, positive self talk and self-care, staying in touch with people who are supportive of her, search for more positives versus negatives each day, healthy nutrition and exercise, decrease her overthinking and over analyzing, letting go of things in the past that can hold her back now, continue to be involved in the church that she has found and feels like is a good fit, and realize the strength she shows working with goal-directed behaviors to move in a direction that supports her overall improved emotional health.  Goal review and progress/challenges noted with patient.  Next appointment within 3 weeks.  This record has been created using [employer].  Chart creation errors have been sought, but may not always have been located and corrected.  Such creation errors do not reflect on the standard of medical care provided.   Shanon Ace, LCSW

## 2022-10-03 ENCOUNTER — Ambulatory Visit: Payer: BC Managed Care – PPO | Admitting: Psychiatry

## 2022-10-03 DIAGNOSIS — F411 Generalized anxiety disorder: Secondary | ICD-10-CM

## 2022-10-03 NOTE — Progress Notes (Signed)
[facility] Counselor/Therapist Progress Note  Patient ID: Jessica Gray, MRN: [id number],    Date: 10/03/2022  Time Spent: 50 minutes  Treatment Type: Individual Therapy  Reported Symptoms: anxiety (much improved)  Mental Status Exam:  Appearance:   Casual and Well Groomed     Behavior:  Appropriate, Sharing, and Motivated  Motor:  Normal  Speech/Language:   Clear and Coherent  Affect:  Appropriate  Mood:  normal  Thought process:  normal  Thought content:    WNL  Sensory/Perceptual disturbances:    WNL  Orientation:  oriented to person, place, time/date, situation, day of week, month of year, year, and stated date of October 03, 2022  Attention:  Good  Concentration:  Good and Fair  Memory:  WNL  Fund of knowledge:   Good  Insight:    Good  Judgment:   Good  Impulse Control:  Good   Risk Assessment: Danger to Self:  No Self-injurious Behavior: No Danger to Others: No Duty to Warn:no Physical Aggression / Violence:No  Access to Firearms a concern: No  Gang Involvement:No   Subjective:   Patient today in session and reporting anxiety but "am better able to manage it much better at this point.  Also much more aware of when I need to step back and take a "break", which helps. Much less worrying. Able to "not sweat the small stuff" as much. Irritability improved also. Work has been really busy and stressors that used to "irritate me, I am now better able to control my reactions and not let it take over so much."  "Much less short temperedness".  Work frustrations have increased recently. Processed her noticeable progress, giving specific examples. "I don't dread the day like I used to." Don't feel as "debilitated by some symptoms".  Improved self-care and boundary setting.  Overall better "emotional management."  Has been able to meet her current therapy goals in terms of elevating mood and being involved in more activities including joining a church where she is getting  involved more and enjoying it. Anger decreased and mood improved. Ongoing positive self-talk has been a much-needed change.  Doing well and we agree that patient can return if/when needed.   Patient feeling good about the progress she has made.  Interventions: Cognitive Behavioral Therapy and Ego-Supportive  Long-term goal: Elevate mood and show evidence of usual energy, activities, and socialization level. Short-term goal: Verbalize an understanding of the relationship between repressed anger and depressed mood. Strategies: Identify cognitive self-talk that is engaged in and supports depression and work to change the self-talk to be more positive and proactive  Diagnosis:   ICD-10-CM   1. Generalized anxiety disorder  F41.1      Plan:  Patient today motivated and actively participating in session focusing more today on her personal growth and the progress she has made in meeting her treatment goals.  As noted above she has met each of her goals and is feeling good about her progress.  Agreed that she can return if/when needed. Encouraged patient in her practice of more positive/self affirming behaviors as discussed in session including: Staying in the present and focusing on what she can control or change, believing more in herself that she can continue making significant changes to move forward in a healthier direction emotionally/spiritually/mentally/physically, positive self talk, staying in touch with people who are supportive of her, search for more positives versus negatives daily, healthy nutrition and exercise, decrease her overthinking and over analyzing,  letting go of things in the past that can hold her back currently, continue to be involved in the church that she has found and feels that is a good fit for her, and recognize the strength she shows working with goal-directed behaviors to move in a direction that supports her overall improved emotional health and outlook.  Goal review  and progress/challenges noted with patient.  Next appointment within 3 weeks.  This record has been created using [employer].  Chart creation errors have been sought, but may not always have been located and corrected.  Such creation errors do not reflect on the standard of medical care provided.   Shanon Ace, LCSW

## 2024-03-15 IMAGING — CT CT HEAD W/O CM
3 series · 14 of 45 positions shown, 16 images · non-contrast
Comparison: None Available.

CLINICAL DATA: Fall, left head injury, on Eliquis



[Series 2: head wo · axial · 0.46mm/px · z∈[+714,+830]mm · 8 of 28 slices shown, 10 images]
[im 3/28  brain]
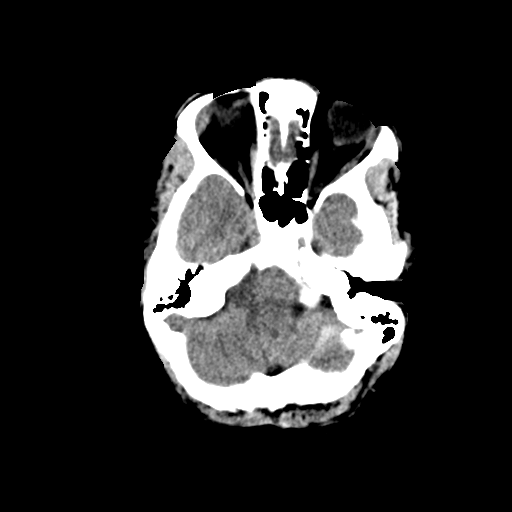
[im 3/28  bone]
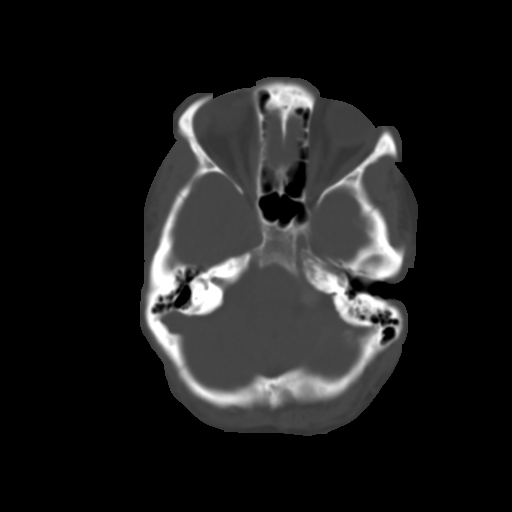
[im 6/28  brain]
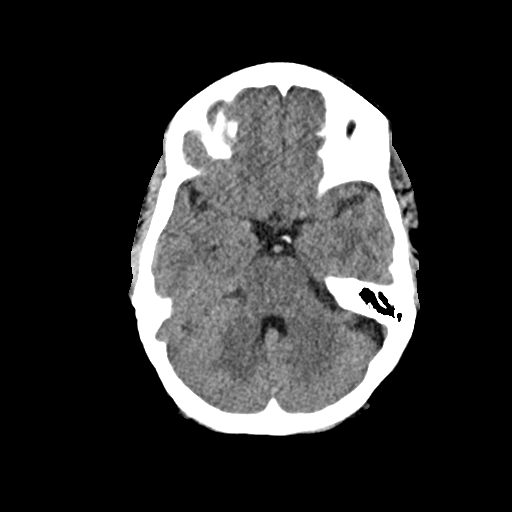
[im 10/28  brain]
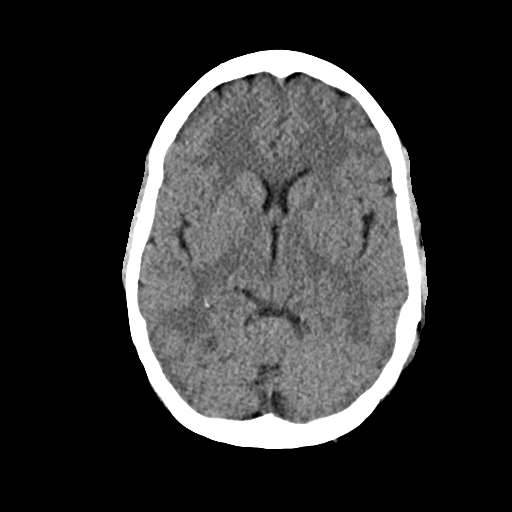
[im 13/28  brain]
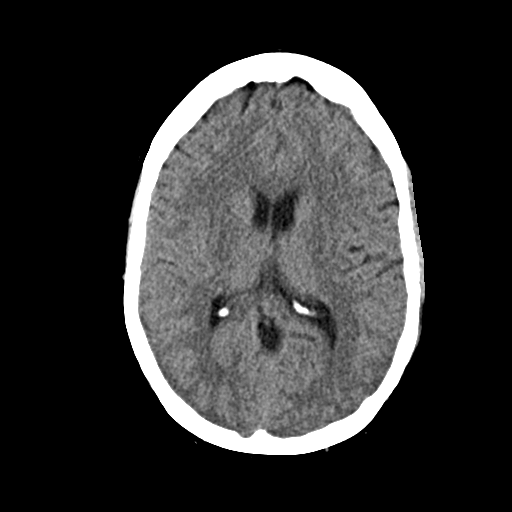
[im 16/28  brain]
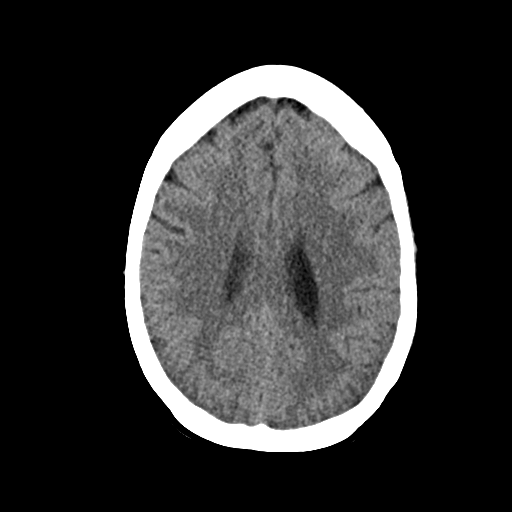
[im 16/28  bone]
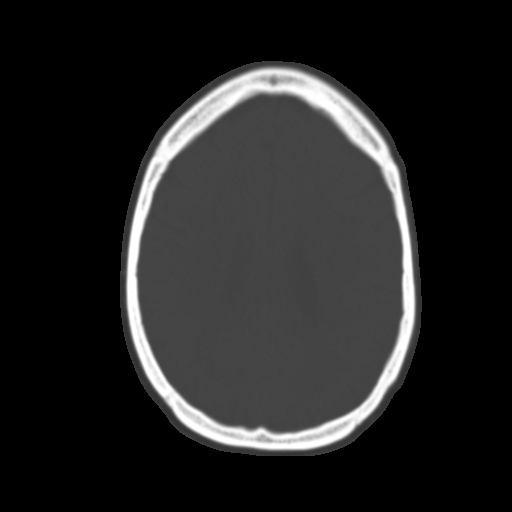
[im 19/28  brain]
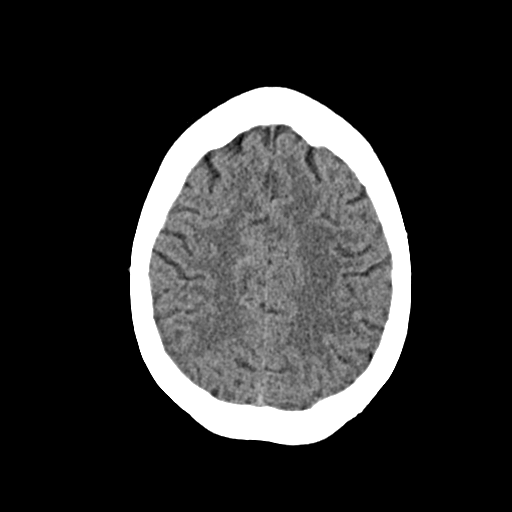
[im 23/28  brain]
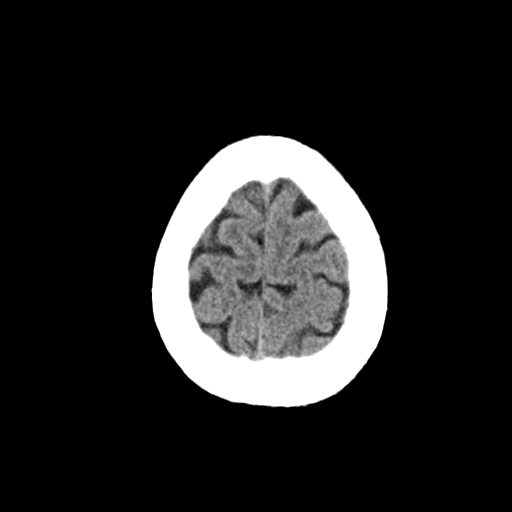
[im 26/28  brain]
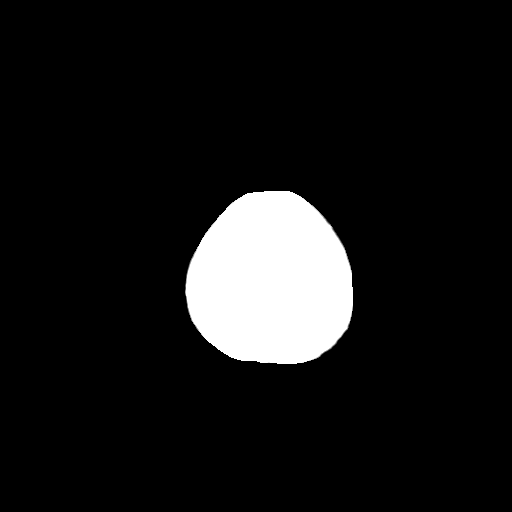

[Series 4: coronal soft · coronal · 0.27mm/px · 3 of 71 slices shown]
[im 24/71  brain]
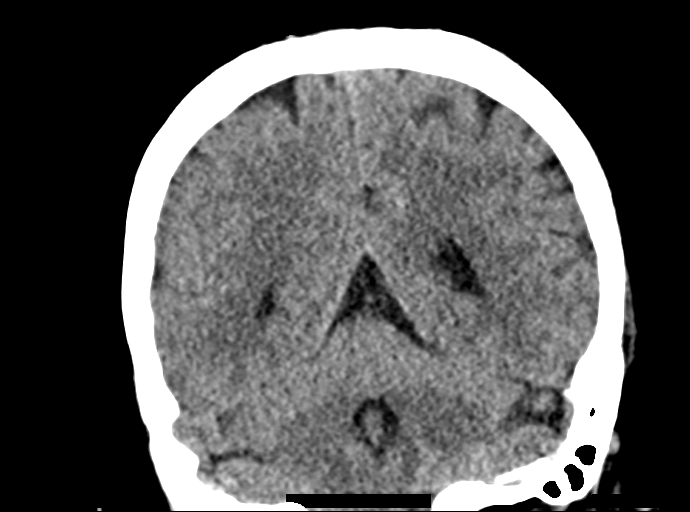
[im 32/71  brain]
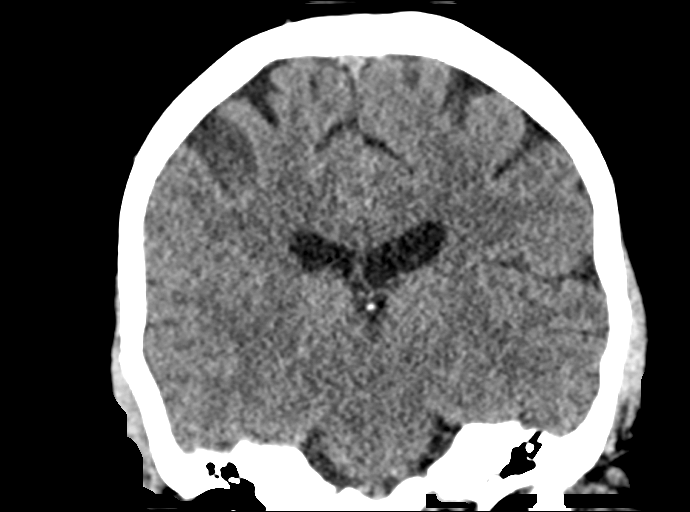
[im 39/71  brain]
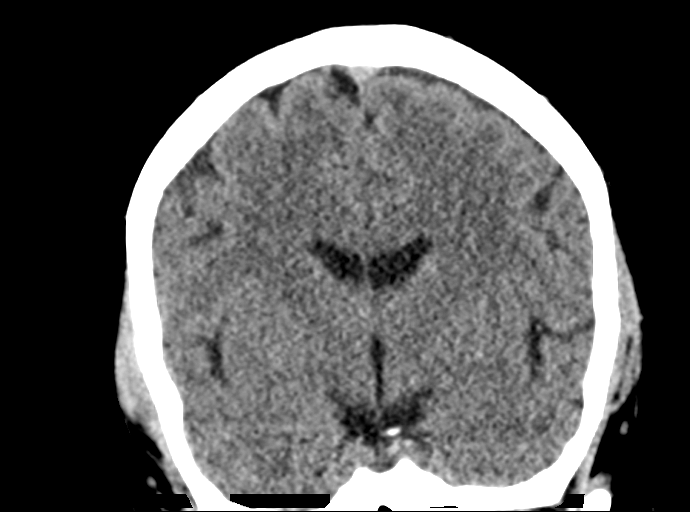

[Series 5: sag soft · sagittal · 0.29mm/px · 3 of 67 slices shown]
[im 23/67  brain]
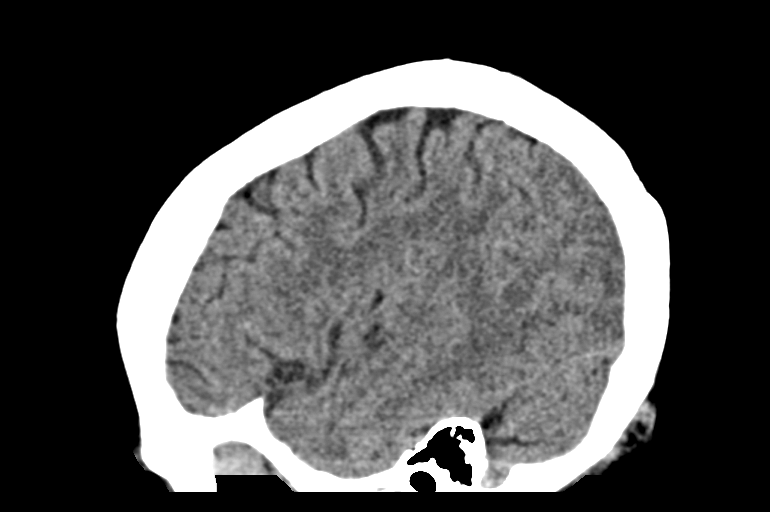
[im 34/67  brain]
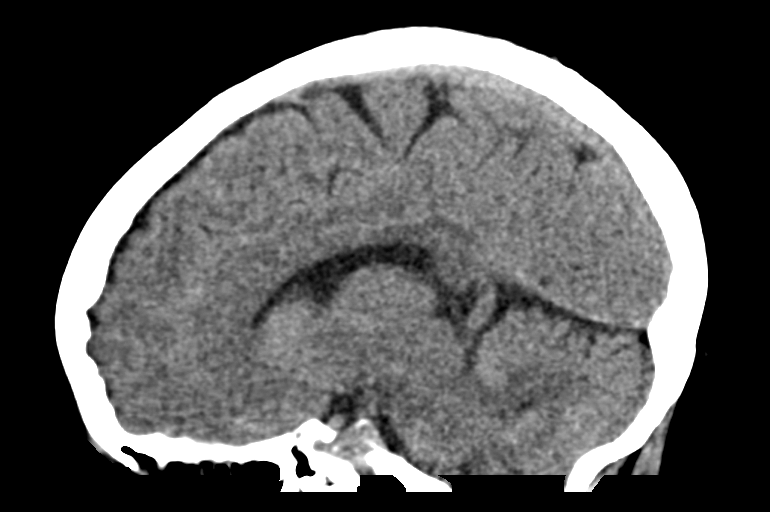
[im 45/67  brain]
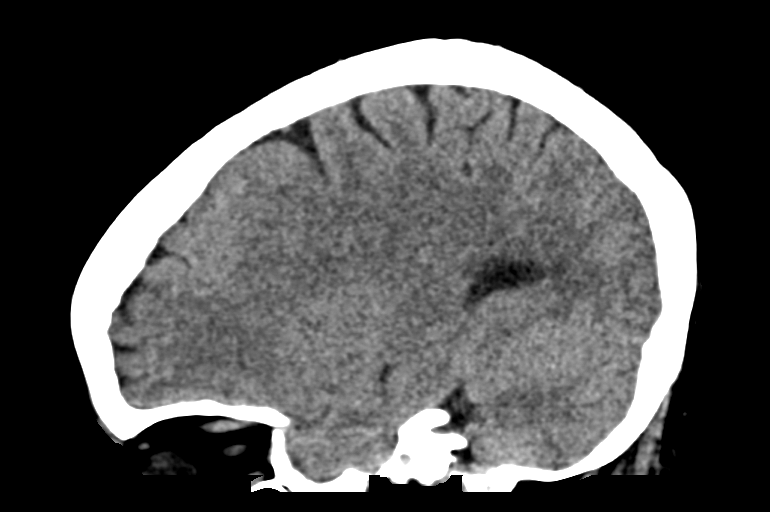

[14 of 45 positions shown; findings below may reference images not displayed]

FINDINGS: Brain: No evidence of acute infarction, hemorrhage, hydrocephalus,
extra-axial collection or mass lesion/mass effect.

Vascular: No hyperdense vessel or unexpected calcification.

Skull: Normal. Negative for fracture or focal lesion.

Sinuses/Orbits: The visualized paranasal sinuses are essentially
clear. The mastoid air cells are unopacified.

Other: None.
IMPRESSION: Normal head CT.
